# Patient Record
Sex: Female | Born: 1960
Health system: Southern US, Community
[De-identification: ages and names within clinical notes are randomized; demographics above are authoritative.]

## PROBLEM LIST (undated history)

## (undated) DIAGNOSIS — E756 Lipid storage disorder, unspecified: Secondary | ICD-10-CM

## (undated) DIAGNOSIS — K5229 Other allergic and dietetic gastroenteritis and colitis: Secondary | ICD-10-CM

## (undated) DIAGNOSIS — R202 Paresthesia of skin: Secondary | ICD-10-CM

## (undated) DIAGNOSIS — E8809 Other disorders of plasma-protein metabolism, not elsewhere classified: Secondary | ICD-10-CM

## (undated) HISTORY — PX: LASIK: SHX215

## (undated) HISTORY — PX: HAND SURGERY: SHX662

## (undated) HISTORY — DX: Lipid storage disorder, unspecified: E75.6

## (undated) HISTORY — PX: CARPAL TUNNEL RELEASE: SHX101

## (undated) HISTORY — DX: Other allergic and dietetic gastroenteritis and colitis: K52.29

## (undated) HISTORY — DX: Paresthesia of skin: R20.2

## (undated) HISTORY — PX: OTHER SURGICAL HISTORY: SHX169

## (undated) HISTORY — PX: DILATION AND CURETTAGE OF UTERUS: SHX78

## (undated) HISTORY — DX: Other disorders of plasma-protein metabolism, not elsewhere classified: E88.09

---

## 2006-01-07 ENCOUNTER — Encounter: Admission: RE | Admit: 2006-01-07 | Discharge: 2006-01-07 | Payer: Self-pay | Admitting: General Surgery

## 2006-07-12 ENCOUNTER — Encounter: Admission: RE | Admit: 2006-07-12 | Discharge: 2006-07-12 | Payer: Self-pay | Admitting: *Deleted

## 2007-01-25 ENCOUNTER — Encounter: Admission: RE | Admit: 2007-01-25 | Discharge: 2007-01-25 | Payer: Self-pay | Admitting: *Deleted

## 2008-03-01 ENCOUNTER — Encounter: Admission: RE | Admit: 2008-03-01 | Discharge: 2008-03-01 | Payer: Self-pay | Admitting: Family Medicine

## 2009-03-19 ENCOUNTER — Encounter: Admission: RE | Admit: 2009-03-19 | Discharge: 2009-03-19 | Payer: Self-pay | Admitting: Family Medicine

## 2010-04-20 ENCOUNTER — Encounter
Admission: RE | Admit: 2010-04-20 | Discharge: 2010-04-20 | Payer: Self-pay | Source: Home / Self Care | Attending: Family Medicine | Admitting: Family Medicine

## 2011-06-15 ENCOUNTER — Other Ambulatory Visit: Payer: Self-pay | Admitting: Family Medicine

## 2011-06-15 DIAGNOSIS — Z1231 Encounter for screening mammogram for malignant neoplasm of breast: Secondary | ICD-10-CM

## 2011-07-02 ENCOUNTER — Ambulatory Visit
Admission: RE | Admit: 2011-07-02 | Discharge: 2011-07-02 | Disposition: A | Discharge status: Home / Self Care | Payer: Self-pay | Source: Ambulatory Visit | Attending: Family Medicine | Admitting: Family Medicine

## 2011-07-02 DIAGNOSIS — Z1231 Encounter for screening mammogram for malignant neoplasm of breast: Secondary | ICD-10-CM

## 2012-10-20 ENCOUNTER — Other Ambulatory Visit: Payer: Self-pay

## 2012-10-20 DIAGNOSIS — Z1231 Encounter for screening mammogram for malignant neoplasm of breast: Secondary | ICD-10-CM

## 2012-11-09 ENCOUNTER — Ambulatory Visit
Admission: RE | Admit: 2012-11-09 | Discharge: 2012-11-09 | Disposition: A | Discharge status: Home / Self Care | Payer: BC Managed Care – PPO | Source: Ambulatory Visit

## 2012-11-09 DIAGNOSIS — Z1231 Encounter for screening mammogram for malignant neoplasm of breast: Secondary | ICD-10-CM

## 2013-12-19 ENCOUNTER — Other Ambulatory Visit: Payer: Self-pay

## 2013-12-19 DIAGNOSIS — Z1231 Encounter for screening mammogram for malignant neoplasm of breast: Secondary | ICD-10-CM

## 2013-12-26 ENCOUNTER — Ambulatory Visit: Payer: BC Managed Care – PPO

## 2013-12-28 ENCOUNTER — Ambulatory Visit: Payer: BC Managed Care – PPO

## 2013-12-31 ENCOUNTER — Ambulatory Visit
Admission: RE | Admit: 2013-12-31 | Discharge: 2013-12-31 | Disposition: A | Discharge status: Home / Self Care | Payer: BC Managed Care – PPO | Source: Ambulatory Visit

## 2013-12-31 DIAGNOSIS — Z1231 Encounter for screening mammogram for malignant neoplasm of breast: Secondary | ICD-10-CM

## 2015-03-03 ENCOUNTER — Other Ambulatory Visit: Payer: Self-pay

## 2015-03-03 DIAGNOSIS — Z1231 Encounter for screening mammogram for malignant neoplasm of breast: Secondary | ICD-10-CM

## 2015-03-21 ENCOUNTER — Ambulatory Visit: Payer: Self-pay

## 2015-03-27 ENCOUNTER — Ambulatory Visit
Admission: RE | Admit: 2015-03-27 | Discharge: 2015-03-27 | Disposition: A | Discharge status: Home / Self Care | Payer: BLUE CROSS/BLUE SHIELD | Source: Ambulatory Visit

## 2015-03-27 DIAGNOSIS — Z1231 Encounter for screening mammogram for malignant neoplasm of breast: Secondary | ICD-10-CM

## 2015-10-06 ENCOUNTER — Ambulatory Visit (INDEPENDENT_AMBULATORY_CARE_PROVIDER_SITE_OTHER): Payer: BLUE CROSS/BLUE SHIELD | Admitting: Otolaryngology

## 2015-10-06 DIAGNOSIS — H6981 Other specified disorders of Eustachian tube, right ear: Secondary | ICD-10-CM

## 2015-10-06 DIAGNOSIS — J310 Chronic rhinitis: Secondary | ICD-10-CM | POA: Diagnosis not present

## 2015-10-06 DIAGNOSIS — H9011 Conductive hearing loss, unilateral, right ear, with unrestricted hearing on the contralateral side: Secondary | ICD-10-CM | POA: Diagnosis not present

## 2015-10-06 DIAGNOSIS — H6521 Chronic serous otitis media, right ear: Secondary | ICD-10-CM | POA: Diagnosis not present

## 2015-10-06 DIAGNOSIS — J343 Hypertrophy of nasal turbinates: Secondary | ICD-10-CM

## 2015-11-10 ENCOUNTER — Ambulatory Visit (INDEPENDENT_AMBULATORY_CARE_PROVIDER_SITE_OTHER): Payer: BLUE CROSS/BLUE SHIELD | Admitting: Otolaryngology

## 2016-04-14 ENCOUNTER — Other Ambulatory Visit: Payer: Self-pay | Admitting: Family Medicine

## 2016-04-14 DIAGNOSIS — Z1231 Encounter for screening mammogram for malignant neoplasm of breast: Secondary | ICD-10-CM

## 2016-05-07 ENCOUNTER — Ambulatory Visit
Admission: RE | Admit: 2016-05-07 | Discharge: 2016-05-07 | Disposition: A | Discharge status: Home / Self Care | Payer: BLUE CROSS/BLUE SHIELD | Source: Ambulatory Visit | Attending: Family Medicine | Admitting: Family Medicine

## 2016-05-07 DIAGNOSIS — Z1231 Encounter for screening mammogram for malignant neoplasm of breast: Secondary | ICD-10-CM | POA: Diagnosis not present

## 2016-10-13 DIAGNOSIS — D509 Iron deficiency anemia, unspecified: Secondary | ICD-10-CM | POA: Diagnosis not present

## 2016-10-13 DIAGNOSIS — R739 Hyperglycemia, unspecified: Secondary | ICD-10-CM | POA: Diagnosis not present

## 2016-10-13 DIAGNOSIS — K210 Gastro-esophageal reflux disease with esophagitis: Secondary | ICD-10-CM | POA: Diagnosis not present

## 2016-10-13 DIAGNOSIS — R7301 Impaired fasting glucose: Secondary | ICD-10-CM | POA: Diagnosis not present

## 2016-10-20 DIAGNOSIS — R7301 Impaired fasting glucose: Secondary | ICD-10-CM | POA: Diagnosis not present

## 2016-10-20 DIAGNOSIS — G5602 Carpal tunnel syndrome, left upper limb: Secondary | ICD-10-CM | POA: Diagnosis not present

## 2016-10-20 DIAGNOSIS — G2581 Restless legs syndrome: Secondary | ICD-10-CM | POA: Diagnosis not present

## 2016-10-20 DIAGNOSIS — K210 Gastro-esophageal reflux disease with esophagitis: Secondary | ICD-10-CM | POA: Diagnosis not present

## 2016-10-28 DIAGNOSIS — G5621 Lesion of ulnar nerve, right upper limb: Secondary | ICD-10-CM | POA: Insufficient documentation

## 2016-10-28 DIAGNOSIS — M79641 Pain in right hand: Secondary | ICD-10-CM | POA: Insufficient documentation

## 2016-10-28 DIAGNOSIS — M5090 Cervical disc disorder, unspecified, unspecified cervical region: Secondary | ICD-10-CM | POA: Insufficient documentation

## 2016-10-28 DIAGNOSIS — M79642 Pain in left hand: Secondary | ICD-10-CM | POA: Diagnosis not present

## 2016-10-28 DIAGNOSIS — G5603 Carpal tunnel syndrome, bilateral upper limbs: Secondary | ICD-10-CM | POA: Diagnosis not present

## 2016-11-09 DIAGNOSIS — M79642 Pain in left hand: Secondary | ICD-10-CM | POA: Diagnosis not present

## 2016-11-09 DIAGNOSIS — M79641 Pain in right hand: Secondary | ICD-10-CM | POA: Diagnosis not present

## 2016-11-13 DIAGNOSIS — Z6827 Body mass index (BMI) 27.0-27.9, adult: Secondary | ICD-10-CM | POA: Diagnosis not present

## 2016-11-13 DIAGNOSIS — G43909 Migraine, unspecified, not intractable, without status migrainosus: Secondary | ICD-10-CM | POA: Diagnosis not present

## 2016-11-15 DIAGNOSIS — M5090 Cervical disc disorder, unspecified, unspecified cervical region: Secondary | ICD-10-CM | POA: Diagnosis not present

## 2016-11-15 DIAGNOSIS — M79642 Pain in left hand: Secondary | ICD-10-CM | POA: Diagnosis not present

## 2016-11-15 DIAGNOSIS — M79641 Pain in right hand: Secondary | ICD-10-CM | POA: Diagnosis not present

## 2016-11-23 ENCOUNTER — Other Ambulatory Visit: Payer: Self-pay | Admitting: Orthopedic Surgery

## 2016-11-23 DIAGNOSIS — M5412 Radiculopathy, cervical region: Secondary | ICD-10-CM

## 2016-12-04 ENCOUNTER — Ambulatory Visit
Admission: RE | Admit: 2016-12-04 | Discharge: 2016-12-04 | Disposition: A | Discharge status: Home / Self Care | Payer: Self-pay | Source: Ambulatory Visit | Attending: Orthopedic Surgery | Admitting: Orthopedic Surgery

## 2016-12-04 DIAGNOSIS — M5412 Radiculopathy, cervical region: Secondary | ICD-10-CM

## 2016-12-04 DIAGNOSIS — M50221 Other cervical disc displacement at C4-C5 level: Secondary | ICD-10-CM | POA: Diagnosis not present

## 2017-01-03 DIAGNOSIS — Z6827 Body mass index (BMI) 27.0-27.9, adult: Secondary | ICD-10-CM | POA: Diagnosis not present

## 2017-01-03 DIAGNOSIS — G43719 Chronic migraine without aura, intractable, without status migrainosus: Secondary | ICD-10-CM | POA: Diagnosis not present

## 2017-01-07 DIAGNOSIS — H748X1 Other specified disorders of right middle ear and mastoid: Secondary | ICD-10-CM | POA: Diagnosis not present

## 2017-01-07 DIAGNOSIS — G43719 Chronic migraine without aura, intractable, without status migrainosus: Secondary | ICD-10-CM | POA: Diagnosis not present

## 2017-01-07 DIAGNOSIS — G43909 Migraine, unspecified, not intractable, without status migrainosus: Secondary | ICD-10-CM | POA: Diagnosis not present

## 2017-01-17 DIAGNOSIS — M79601 Pain in right arm: Secondary | ICD-10-CM | POA: Diagnosis not present

## 2017-01-17 DIAGNOSIS — M5412 Radiculopathy, cervical region: Secondary | ICD-10-CM | POA: Diagnosis not present

## 2017-01-17 DIAGNOSIS — R202 Paresthesia of skin: Secondary | ICD-10-CM | POA: Diagnosis not present

## 2017-01-17 DIAGNOSIS — M542 Cervicalgia: Secondary | ICD-10-CM | POA: Diagnosis not present

## 2017-01-31 ENCOUNTER — Encounter: Payer: Self-pay | Admitting: Neurology

## 2017-02-08 DIAGNOSIS — L240 Irritant contact dermatitis due to detergents: Secondary | ICD-10-CM | POA: Diagnosis not present

## 2017-02-08 DIAGNOSIS — Z6827 Body mass index (BMI) 27.0-27.9, adult: Secondary | ICD-10-CM | POA: Diagnosis not present

## 2017-02-17 ENCOUNTER — Ambulatory Visit (INDEPENDENT_AMBULATORY_CARE_PROVIDER_SITE_OTHER): Payer: BLUE CROSS/BLUE SHIELD | Admitting: Otolaryngology

## 2017-02-17 DIAGNOSIS — J343 Hypertrophy of nasal turbinates: Secondary | ICD-10-CM

## 2017-02-17 DIAGNOSIS — R51 Headache: Secondary | ICD-10-CM | POA: Diagnosis not present

## 2017-02-17 DIAGNOSIS — J310 Chronic rhinitis: Secondary | ICD-10-CM

## 2017-05-02 DIAGNOSIS — F419 Anxiety disorder, unspecified: Secondary | ICD-10-CM | POA: Insufficient documentation

## 2017-05-02 DIAGNOSIS — K219 Gastro-esophageal reflux disease without esophagitis: Secondary | ICD-10-CM | POA: Insufficient documentation

## 2017-05-06 ENCOUNTER — Ambulatory Visit: Payer: Self-pay | Admitting: Neurology

## 2017-05-13 ENCOUNTER — Other Ambulatory Visit (INDEPENDENT_AMBULATORY_CARE_PROVIDER_SITE_OTHER): Payer: BLUE CROSS/BLUE SHIELD

## 2017-05-13 ENCOUNTER — Ambulatory Visit: Payer: BLUE CROSS/BLUE SHIELD | Admitting: Neurology

## 2017-05-13 ENCOUNTER — Encounter: Payer: Self-pay | Admitting: Neurology

## 2017-05-13 VITALS — BP 100/60 | HR 87 | Ht 59.0 in | Wt 138.1 lb

## 2017-05-13 DIAGNOSIS — R519 Headache, unspecified: Secondary | ICD-10-CM

## 2017-05-13 DIAGNOSIS — R51 Headache: Secondary | ICD-10-CM

## 2017-05-13 DIAGNOSIS — G5623 Lesion of ulnar nerve, bilateral upper limbs: Secondary | ICD-10-CM | POA: Insufficient documentation

## 2017-05-13 DIAGNOSIS — M4802 Spinal stenosis, cervical region: Secondary | ICD-10-CM

## 2017-05-13 LAB — SEDIMENTATION RATE: Sed Rate: 11 mm/h (ref 0–30)

## 2017-05-13 LAB — C-REACTIVE PROTEIN: CRP: 0.3 mg/dL — ABNORMAL LOW (ref 0.5–20.0)

## 2017-05-13 LAB — VITAMIN B12: Vitamin B-12: 708 pg/mL (ref 211–911)

## 2017-05-13 MED ORDER — GABAPENTIN 300 MG PO CAPS: 300.0000 mg | ORAL_CAPSULE | Freq: Every day | ORAL | 5 refills | Status: DC

## 2017-05-13 NOTE — Progress Notes (Signed)
[facility] Clinic Note - Initial Visit   Date: 05/13/17  Bonnie Mckee MRN: [id number] DOB: [birth year]   Dear Dr. Vertell Limber:  Thank you for your kind referral of Bonnie Mckee for consultation of bilateral arm paresthesias. Although her history is well known to you, please allow Bonnie Mckee to reiterate it for the purpose of our medical record. The patient was accompanied to the clinic by self.   History of Present Illness: Bonnie Mckee is a 57 y.o. right-handed Caucasian female with s/p bilateral CTS release x 2 and bilateral ulnar nerve decompression, GERD, and anxiety  presenting for evaluation of bilateral hand pain.    Starting in early 2018, she began experiencing episodic numbness and tingling in the hands, involving the 4th and 5th digits.  She has found that laying supine, reading/holding arms up, or driving can aggravate her numbness, especially if her arms are in this position for a long time. She has stabbing pain in the elbow region which occurs a few times per week, lasting a few minutes.  She also has achy pain in the elbow and fingers. She has weakness of the hands and finds herself dropping objects.   She initially saw Dr. Burney Gauze, orthopeadics, in July 2018 who ordered NCS/EMG of the arms which showed probably C8 radiculopathy (study is not available to review personally).  Therefore, she had imaging of the cervical spine in September 2018 which showed foraminal stenosis and canal stenosis at C5-6 and C6-7, as well as a very small focal cord signal hyperintensity at C5-6.  She was then referred to Dr. Vertell Limber for further evaluation who did not feel strongly that her symptoms were stemming from her neck and suggested that she see me.  Around June 2018, she also complains of pulsating and "whooshing" headache in her head, which was constant and severe initially.  She had MRI brain performed at [facility] which did not show any structural pathology, on my review, there  are two small left frontal cortical hyperintensities and otherwise unremarkable.  Her pulsating headaches continued and she saw ENT whose evaluation was negative.  She has no prior history of headaches.  She denies nausea, vomiting, vision changes.   Overall, headaches are improving and not as severe as before.  Out-side paper records, electronic medical record, and images have been reviewed where available and summarized as:  MRI cervical spine wo contrast 01/17/2017: 1. Moderate right foraminal narrowing at C5-6 and C6-7 due to uncovertebral disease. 2. Slightly less prominent left moderate foraminal narrowing is present at C5-6. 3. Partial effacement of the ventral CSF at C5-6. No other significant central canal stenosis.  A focal area of cord signal abnormality is present posteriorly at C5-6 level. No other significant cord signal abnormality is present. This may be secondary to mass effect on the cord or trauma.  Past Medical History:  Diagnosis Date  . Paresthesias     Past Surgical History:  Procedure Laterality Date  . CARPAL TUNNEL RELEASE    . CESAREAN SECTION    . rectal sphincter repair       Medications:  Outpatient Encounter Medications as of 05/13/2017  Medication Sig  . cyclobenzaprine (FLEXERIL) 10 MG tablet TAKE ONE TABLET BY MOUTH AT BEDTIME.  . famotidine (PEPCID) 20 MG tablet Take 20 mg by mouth 2 (two) times daily.  Marland Kitchen FLUoxetine (PROZAC) 10 MG capsule Prozac 10 mg capsule  Take 1 capsule every day by oral route.  . OMEPRAZOLE PO omeprazole 20 mg capsule,delayed release  Take 1 capsule every day by oral route in the morning for 30 days.  Marland Kitchen PROAIR HFA 108 (90 Base) MCG/ACT inhaler   . Probiotic Product (Miguel Barrera) Hardin Negus' Colon Health  1 po qd  . triamcinolone cream (KENALOG) 0.1 % APPLY TOPICALLY TO AFFECTED AREAS TWICE DAILY  . [DISCONTINUED] FLUoxetine (PROZAC) 10 MG tablet Take 10 mg by mouth daily.   No facility-administered encounter  medications on file as of 05/13/2017.      Allergies:  Allergies  Allergen Reactions  . Hydrocodone-Acetaminophen Itching    Family History: Family History  Problem Relation Age of Onset  . Non-Hodgkin's lymphoma Father     Social History: Social History   Tobacco Use  . Smoking status: Former Smoker    Types: Cigarettes  . Smokeless tobacco: Never Used  Substance Use Topics  . Alcohol use: No    Frequency: Never  . Drug use: No   Social History   Social History Narrative   Lives with husband in a one story home with a basement.  Has 3 grown children.  Works as a Pharmacist, hospital.  Education: college.     Review of Systems:  CONSTITUTIONAL: No fevers, chills, night sweats, or weight loss.  + headaches EYES: No visual changes or eye pain ENT: No hearing changes.  No history of nose bleeds.   RESPIRATORY: No cough, wheezing and shortness of breath.   CARDIOVASCULAR: Negative for chest pain, and palpitations.   GI: Negative for abdominal discomfort, blood in stools or black stools.  No recent change in bowel habits.   GU:  No history of incontinence.   MUSCLOSKELETAL: +history of joint pain or swelling.  No myalgias.   SKIN: Negative for lesions, rash, and itching.   HEMATOLOGY/ONCOLOGY: Negative for prolonged bleeding, bruising easily, and swollen nodes.  No history of cancer.   ENDOCRINE: Negative for cold or heat intolerance, polydipsia or goiter.   PSYCH:  No depression or anxiety symptoms.   NEURO: As Above.   Vital Signs:  BP 100/60   Pulse 87   Ht 4' 11"  (1.499 m)   Wt 138 lb 2 oz (62.7 kg)   SpO2 97%   BMI 27.90 kg/m    General Medical Exam:   General:  Well appearing, comfortable.   Eyes/ENT: see cranial nerve examination.   Neck: No masses appreciated.  Full range of motion without tenderness.  No carotid bruits. Respiratory:  Clear to auscultation, good air entry bilaterally.   Cardiac:  Regular rate and rhythm, no murmur.   Extremities:  No deformities,  edema, or skin discoloration.  Skin:  No rashes or lesions.  Heavily tattooed arms  Neurological Exam: MENTAL STATUS including orientation to time, place, person, recent and remote memory, attention span and concentration, language, and fund of knowledge is normal.  Speech is not dysarthric.  CRANIAL NERVES: II:  No visual field defects.  Unremarkable fundi.   III-IV-VI: Pupils equal round and reactive to light.  Normal conjugate, extra-ocular eye movements in all directions of gaze.  No nystagmus.  Subtle left ptosis at rest.   V:  Normal facial sensation.    Jaw jerk is absent VII:  Normal facial symmetry and movements.  No pathologic facial reflexes.  VIII:  Normal hearing and vestibular function.   IX-X:  Normal palatal movement.   XI:  Normal shoulder shrug and head rotation.   XII:  Normal tongue strength and range of motion, no deviation or fasciculation.  MOTOR:  No  atrophy, fasciculations or abnormal movements.  No pronator drift.  Tone is normal.    Right Upper Extremity:    Left Upper Extremity:    Deltoid  5/5   Deltoid  5/5   Biceps  5/5   Biceps  5/5   Triceps  5/5   Triceps  5/5   Wrist extensors  5/5   Wrist extensors  5/5   Wrist flexors  5/5   Wrist flexors  5/5   Finger extensors  5/5   Finger extensors  5/5   Finger flexors  5/5   Finger flexors  5/5   Dorsal interossei  4+/5   Dorsal interossei  4+/5   Abductor pollicis  5/5   Abductor pollicis  5/5   Tone (Ashworth scale)  0  Tone (Ashworth scale)  0   Right Lower Extremity:    Left Lower Extremity:    Hip flexors  5/5   Hip flexors  5/5   Hip extensors  5/5   Hip extensors  5/5   Knee flexors  5/5   Knee flexors  5/5   Knee extensors  5/5   Knee extensors  5/5   Dorsiflexors  5/5   Dorsiflexors  5/5   Plantarflexors  5/5   Plantarflexors  5/5   Toe extensors  5/5   Toe extensors  5/5   Toe flexors  5/5   Toe flexors  5/5   Tone (Ashworth scale)  0  Tone (Ashworth scale)  0   MSRs:  Right                                                                  Left brachioradialis 2+  brachioradialis 2+  biceps 2+  biceps 2+  triceps 2+  triceps 2+  patellar 2+  patellar 2+  ankle jerk 2+  ankle jerk 2+  Hoffman no  Hoffman no  plantar response down  plantar response down   SENSORY:  Normal and symmetric perception of light touch, pinprick, vibration, and proprioception.  There is tenderness over the medial epicondyle and ulnar nerve is palpable.  Tinel's sign is markedly positive on the left >right at the elbow.  Tinel's is negative at the wrist.  COORDINATION/GAIT: Normal finger-to- nose-finger.  Intact rapid alternating movements bilaterally.  Gait narrow based and stable. Tandem and stressed gait intact.    IMPRESSION: 1.  Bilateral hand paresthesias with history of bilateral CTS release x 2 and bilateral ulnar nerve decompression, most concerning for ulnar nerve entrapment either by scar tissue or focal nerve compression/strething from positioning.  Her paresthesias conform to the ulnar nerve distribution bilaterally and on exam there highly positive Tinel's sign over the ulnar nerve at the elbow, worse on the left.   I have requested the report of her NCS/EMG performed at [facility] as I would have expected some changes in her ulnar nerve response.   Because of her history of ulnar nerve decompression, I will order ultrasound of the ulnar nerves to assess for structural changes (focal swelling, scarring, etc) across the elbow.   For symptom management, start gabapentin 315m at bedtime and titrate as needed.  She was also instructed to use a soft elbow pad to prevent repeated compression of the nerve and  use as an elbow block at night to prevent hyperflexion.  2.  New onset headaches in patient > 80 years old warrants additional imaging.  MRI brain did not show any primary abnormalities for her headaches.  To be complete, need to assess her vessel status with CTA head and neck to be sure aneurysm  is excluded.   3.  Cervical canal and foraminal stenosis at C5-6 and C6-7 with focal signal abnormality at C5-6.  Imaging personally viewed with patient.  There is a tiny central cord abnormality, which would not explain her arm paresthesias, but does not need to be assessed.  There are two small areas on her MRI brain which in combination with a cervical lesion, could indicate additional work-up for demyelinating disease, however, given that all lesions are very small, it is best to repeat MRI c-spine wwo contrast.  If there is enhancement or additional focal changes, CSF testing would be indicated.  In the meantime, check vitamin B12, ESR, CRP, copper, MMA.  Further recommendations will be based on her results  Thank you for allowing me to participate in patient's care.  If I can answer any additional questions, I would be pleased to do so.    Sincerely,    Donika K. Posey Pronto, DO

## 2017-05-13 NOTE — Patient Instructions (Addendum)
1.  Check labs  2.  Ultrasound of the ulnar nerves  3.  CTA head and neck  4.  MRI cervical spine wwo contrast  5.  Start gabapentin 300mg  at bedtime.  6.  Use a soft elbow pad to prevent compression of the nerve at your elbow; at night, use it as a block to prevent over flexion

## 2017-05-16 ENCOUNTER — Other Ambulatory Visit: Payer: Self-pay | Admitting: *Deleted

## 2017-05-16 ENCOUNTER — Telehealth: Payer: Self-pay | Admitting: Neurology

## 2017-05-16 DIAGNOSIS — G5621 Lesion of ulnar nerve, right upper limb: Secondary | ICD-10-CM

## 2017-05-16 NOTE — Telephone Encounter (Signed)
Patient called and said that she had a quick question for you and could you please call her. Thanks

## 2017-05-16 NOTE — Telephone Encounter (Signed)
Called patient and she is requesting for her tests to be done at [facility] instead of [facility].  Informed her that I will let her know when they are approved and she can call and schedule these.  Patient will also need valium.

## 2017-05-17 ENCOUNTER — Other Ambulatory Visit: Payer: Self-pay | Admitting: *Deleted

## 2017-05-17 ENCOUNTER — Other Ambulatory Visit: Payer: Self-pay | Admitting: Family Medicine

## 2017-05-17 DIAGNOSIS — R519 Headache, unspecified: Secondary | ICD-10-CM

## 2017-05-17 DIAGNOSIS — G5623 Lesion of ulnar nerve, bilateral upper limbs: Secondary | ICD-10-CM

## 2017-05-17 DIAGNOSIS — R51 Headache: Secondary | ICD-10-CM

## 2017-05-17 DIAGNOSIS — M4802 Spinal stenosis, cervical region: Secondary | ICD-10-CM

## 2017-05-17 DIAGNOSIS — Z1231 Encounter for screening mammogram for malignant neoplasm of breast: Secondary | ICD-10-CM

## 2017-05-17 LAB — COPPER, SERUM: COPPER: 143 ug/dL (ref 70–175)

## 2017-05-17 LAB — METHYLMALONIC ACID, SERUM: METHYLMALONIC ACID, QUANT: 82 nmol/L — AB (ref 87–318)

## 2017-05-20 ENCOUNTER — Telehealth: Payer: Self-pay | Admitting: Neurology

## 2017-05-20 ENCOUNTER — Telehealth: Payer: Self-pay | Admitting: *Deleted

## 2017-05-20 NOTE — Telephone Encounter (Signed)
Patient wants to check on the status of the referral

## 2017-05-20 NOTE — Telephone Encounter (Signed)
MRI brain wwo contrast [facility] 01/07/2017:  Normal MRI of the brain for age.

## 2017-05-20 NOTE — Telephone Encounter (Signed)
Patient given results

## 2017-05-20 NOTE — Telephone Encounter (Signed)
Informed patient that these are being worked on and that Dr. Allena KatzPatel may have to do a peer to peer review to get them covered.

## 2017-05-20 NOTE — Telephone Encounter (Signed)
-----   Message from Glendale Chardonika K Patel, DO sent at 05/17/2017  9:24 AM EST ----- Please notify patient lab are within normal limits.  Thank you.

## 2017-05-23 ENCOUNTER — Other Ambulatory Visit: Payer: Self-pay | Admitting: *Deleted

## 2017-05-23 DIAGNOSIS — R519 Headache, unspecified: Secondary | ICD-10-CM

## 2017-05-23 DIAGNOSIS — M4802 Spinal stenosis, cervical region: Secondary | ICD-10-CM

## 2017-05-23 DIAGNOSIS — R51 Headache: Principal | ICD-10-CM

## 2017-05-25 ENCOUNTER — Other Ambulatory Visit: Payer: Self-pay | Admitting: *Deleted

## 2017-05-25 MED ORDER — DIAZEPAM 5 MG PO TABS: ORAL_TABLET | ORAL | 0 refills | Status: DC

## 2017-05-25 NOTE — Progress Notes (Unsigned)
v

## 2017-05-31 ENCOUNTER — Telehealth: Payer: Self-pay | Admitting: Neurology

## 2017-05-31 NOTE — Telephone Encounter (Signed)
Patient said that she is having US on March 7.   Informed her that we will be in touch with the results.

## 2017-05-31 NOTE — Telephone Encounter (Signed)
Patient called with some questions for you. She was also returning your call. Thanks

## 2017-06-01 ENCOUNTER — Ambulatory Visit (HOSPITAL_COMMUNITY): Payer: BLUE CROSS/BLUE SHIELD

## 2017-06-01 ENCOUNTER — Encounter: Payer: Self-pay | Admitting: Neurology

## 2017-06-01 DIAGNOSIS — I6523 Occlusion and stenosis of bilateral carotid arteries: Secondary | ICD-10-CM | POA: Diagnosis not present

## 2017-06-01 DIAGNOSIS — M47812 Spondylosis without myelopathy or radiculopathy, cervical region: Secondary | ICD-10-CM | POA: Diagnosis not present

## 2017-06-02 ENCOUNTER — Telehealth: Payer: Self-pay | Admitting: Neurology

## 2017-06-02 NOTE — Telephone Encounter (Signed)
Dois DavenportSandra called needing to speak with you regarding patient's  appt for tomorrow. Her EXT is [phone]. Please Call. Thanks

## 2017-06-02 NOTE — Telephone Encounter (Signed)
Left message for Sandra to  call me back  .

## 2017-06-03 ENCOUNTER — Telehealth: Payer: Self-pay | Admitting: Neurology

## 2017-06-03 DIAGNOSIS — R51 Headache: Secondary | ICD-10-CM | POA: Diagnosis not present

## 2017-06-03 NOTE — Telephone Encounter (Signed)
I attempted to contact patient via phone today regarding the results of US carotids and MRI cervical spine, however there was no answer so a message was left for the patient to return my call.   US carotids 06/01/2017:  Less than 50% ICA stenosis bilaterally  MRI cervical spine wo contrast 06/01/2017:   Punctate focus of signal abnormality in the posterior cord at C5-6 does not enhance.  Signal abnormality may be due to prior trauma or some inflammatory process.  The appearance is not typical of demyelinating disease. No change in the cervical spondylosis most notable at C5-6 where the ventral thecal sac is effaced and mild to moderate foraminal narrowing is worse on the right.  There is no enhancement of spinal cord hyperintensity and no change in size, favoring against demyelinating disease.    Donika K. Allena KatzPatel, DO

## 2017-06-06 MED ORDER — NORTRIPTYLINE HCL 10 MG PO CAPS: ORAL_CAPSULE | ORAL | 5 refills | Status: DC

## 2017-06-06 NOTE — Addendum Note (Signed)
Addended by: Glendale ChardPATEL, Kariann Wecker K on: 06/06/2017 04:27 PM   Modules accepted: Orders

## 2017-06-06 NOTE — Telephone Encounter (Signed)
CTA head 06/03/2017:   1.  Negative CTA 2.  Normal CT appearance of brain.   3.  Mild inflammatory/postinflammatory changes in the left sphenoid sinus and right mastoid air cells are stable since 2018, significance doubtful.  Discussed results with patient which has excluded any worrisome causes of her head symptoms.  Recommend treating as migraine variant with nortriptyline.  Start nortriptyline 10mg  at bedtime for 2 week, then increase to 2 tablet at bedtime.  Side effects discussed.  Donika K. Allena KatzPatel, DO

## 2017-06-07 ENCOUNTER — Telehealth: Payer: Self-pay | Admitting: Neurology

## 2017-06-07 NOTE — Telephone Encounter (Signed)
Please call the pharmacy and see if there is option of tablets.

## 2017-06-07 NOTE — Telephone Encounter (Signed)
OK, let's go ahead with gabapentin 300mg  at bedtime x 1 week, then 300mg  twice daily.

## 2017-06-07 NOTE — Telephone Encounter (Signed)
Rx called in to [facility]. 

## 2017-06-07 NOTE — Telephone Encounter (Signed)
I spoke with patient and she can't take capsules due to the gel allergy.  I will have these changed to tablets.  She also wanted to make sure that you did not want to try the gabapentin first.  The only reason she did not start it last time is because she was on doxycycline and it was making her feel bad.  Can we still go with the nortriptyline first?

## 2017-06-07 NOTE — Telephone Encounter (Signed)
Pt left a message regarding her prescription for Nortriptyline and needs a call back

## 2017-06-07 NOTE — Telephone Encounter (Signed)
No option of tablets.

## 2017-06-09 ENCOUNTER — Ambulatory Visit (HOSPITAL_COMMUNITY)
Admission: RE | Admit: 2017-06-09 | Discharge: 2017-06-09 | Disposition: A | Discharge status: Home / Self Care | Payer: BLUE CROSS/BLUE SHIELD | Source: Ambulatory Visit | Attending: Neurology | Admitting: Neurology

## 2017-06-09 ENCOUNTER — Other Ambulatory Visit: Payer: Self-pay | Admitting: Neurology

## 2017-06-09 DIAGNOSIS — G5621 Lesion of ulnar nerve, right upper limb: Secondary | ICD-10-CM | POA: Diagnosis not present

## 2017-06-09 DIAGNOSIS — G5622 Lesion of ulnar nerve, left upper limb: Secondary | ICD-10-CM | POA: Diagnosis not present

## 2017-06-10 ENCOUNTER — Ambulatory Visit: Payer: BLUE CROSS/BLUE SHIELD

## 2017-06-16 ENCOUNTER — Telehealth: Payer: Self-pay | Admitting: Neurology

## 2017-06-16 NOTE — Telephone Encounter (Signed)
Pt left a VM message wanted to know if her test results were back yet and would like a call back if they are

## 2017-06-17 MED ORDER — AMITRIPTYLINE HCL 10 MG PO TABS: 10.0000 mg | ORAL_TABLET | Freq: Every day | ORAL | 3 refills | Status: DC

## 2017-06-17 NOTE — Telephone Encounter (Signed)
Referral sent 

## 2017-06-17 NOTE — Telephone Encounter (Signed)
Nerve US results?

## 2017-06-17 NOTE — Telephone Encounter (Signed)
Called and informed patient that her ultrasound shows scar tissue around the right ulnar nerve, which most likely explains her arm paresthesias.  No evidence of structural pathology on the left side.   She will be referred to hand orthopeadics for further management.  She has many questions regarding her medications and ongoing headaches.  She is taking gapapentin 300mg  at bedtime which initially helped, but is unable to increase gabapentin due to sedation.  She cannot take any capsulated medications due to allergy, so I will start her on amitriptyline 10mg  at bedtime to see if it helps her headaches and paresthesias.  For cervicalgia, refer for neck physiotherapy.  Kou Gucciardo K. Allena KatzPatel, DO

## 2017-07-08 ENCOUNTER — Telehealth: Payer: Self-pay | Admitting: Neurology

## 2017-07-08 ENCOUNTER — Ambulatory Visit
Admission: RE | Admit: 2017-07-08 | Discharge: 2017-07-08 | Disposition: A | Discharge status: Home / Self Care | Payer: BLUE CROSS/BLUE SHIELD | Source: Ambulatory Visit | Attending: Family Medicine | Admitting: Family Medicine

## 2017-07-08 DIAGNOSIS — Z1231 Encounter for screening mammogram for malignant neoplasm of breast: Secondary | ICD-10-CM | POA: Diagnosis not present

## 2017-07-08 NOTE — Telephone Encounter (Signed)
Attempted to call patient back.  The phone just rang and then stopped.  I will try again later.

## 2017-07-08 NOTE — Telephone Encounter (Signed)
Pt called in regards to 2 referrals, not yet has been scheduled for either and pt is having some confusion regarding these referrals, please call and advise CB# [phone]

## 2017-07-11 NOTE — Telephone Encounter (Signed)
Referrals sent again.  PT and Hand and orthopedic surgeon referral sent.

## 2017-07-18 ENCOUNTER — Telehealth: Payer: Self-pay | Admitting: Neurology

## 2017-07-18 NOTE — Telephone Encounter (Signed)
Patient has back again regarding her Referral. She said it has been 6 weeks since the last test. She is really wanting to make the appointments and be seen please. She is following up on the Referrals that were to be set up. Please Call. Thanks

## 2017-07-18 NOTE — Telephone Encounter (Signed)
Morrie SheldonAshley, please follow up.

## 2017-07-20 NOTE — Telephone Encounter (Signed)
I spoke with patient she has heard from PT and will call [facility] to schedule appt.

## 2017-07-22 DIAGNOSIS — M6281 Muscle weakness (generalized): Secondary | ICD-10-CM | POA: Diagnosis not present

## 2017-07-22 DIAGNOSIS — M542 Cervicalgia: Secondary | ICD-10-CM | POA: Diagnosis not present

## 2017-07-22 DIAGNOSIS — M799 Soft tissue disorder, unspecified: Secondary | ICD-10-CM | POA: Diagnosis not present

## 2017-07-22 DIAGNOSIS — R51 Headache: Secondary | ICD-10-CM | POA: Diagnosis not present

## 2017-07-25 DIAGNOSIS — M799 Soft tissue disorder, unspecified: Secondary | ICD-10-CM | POA: Diagnosis not present

## 2017-07-25 DIAGNOSIS — M6281 Muscle weakness (generalized): Secondary | ICD-10-CM | POA: Diagnosis not present

## 2017-07-25 DIAGNOSIS — M542 Cervicalgia: Secondary | ICD-10-CM | POA: Diagnosis not present

## 2017-07-25 DIAGNOSIS — R51 Headache: Secondary | ICD-10-CM | POA: Diagnosis not present

## 2017-08-22 DIAGNOSIS — M6281 Muscle weakness (generalized): Secondary | ICD-10-CM | POA: Diagnosis not present

## 2017-08-22 DIAGNOSIS — M799 Soft tissue disorder, unspecified: Secondary | ICD-10-CM | POA: Diagnosis not present

## 2017-08-22 DIAGNOSIS — M542 Cervicalgia: Secondary | ICD-10-CM | POA: Diagnosis not present

## 2017-08-22 DIAGNOSIS — R51 Headache: Secondary | ICD-10-CM | POA: Diagnosis not present

## 2017-08-24 DIAGNOSIS — M6281 Muscle weakness (generalized): Secondary | ICD-10-CM | POA: Diagnosis not present

## 2017-08-24 DIAGNOSIS — R51 Headache: Secondary | ICD-10-CM | POA: Diagnosis not present

## 2017-08-24 DIAGNOSIS — M542 Cervicalgia: Secondary | ICD-10-CM | POA: Diagnosis not present

## 2017-08-24 DIAGNOSIS — M799 Soft tissue disorder, unspecified: Secondary | ICD-10-CM | POA: Diagnosis not present

## 2017-08-25 DIAGNOSIS — G5621 Lesion of ulnar nerve, right upper limb: Secondary | ICD-10-CM | POA: Diagnosis not present

## 2017-08-25 DIAGNOSIS — M5090 Cervical disc disorder, unspecified, unspecified cervical region: Secondary | ICD-10-CM | POA: Diagnosis not present

## 2017-08-25 DIAGNOSIS — M79641 Pain in right hand: Secondary | ICD-10-CM | POA: Diagnosis not present

## 2017-08-25 DIAGNOSIS — G5603 Carpal tunnel syndrome, bilateral upper limbs: Secondary | ICD-10-CM | POA: Diagnosis not present

## 2017-08-31 DIAGNOSIS — M6281 Muscle weakness (generalized): Secondary | ICD-10-CM | POA: Diagnosis not present

## 2017-08-31 DIAGNOSIS — M799 Soft tissue disorder, unspecified: Secondary | ICD-10-CM | POA: Diagnosis not present

## 2017-08-31 DIAGNOSIS — R51 Headache: Secondary | ICD-10-CM | POA: Diagnosis not present

## 2017-08-31 DIAGNOSIS — M542 Cervicalgia: Secondary | ICD-10-CM | POA: Diagnosis not present

## 2017-09-01 ENCOUNTER — Telehealth: Payer: Self-pay | Admitting: *Deleted

## 2017-09-01 ENCOUNTER — Other Ambulatory Visit: Payer: Self-pay | Admitting: *Deleted

## 2017-09-01 MED ORDER — AMITRIPTYLINE HCL 10 MG PO TABS: ORAL_TABLET | ORAL | 5 refills | Status: DC

## 2017-09-01 NOTE — Telephone Encounter (Signed)
Patient given instructions and new Rx sent.   

## 2017-09-01 NOTE — Telephone Encounter (Signed)
Patient called to let us know that she is still having headaches.  She would like to increase the amitriptyline at bedtime if possible.  Please advise.

## 2017-09-01 NOTE — Telephone Encounter (Signed)
OK to increase to amitriptyline  (take two  tablets) at bedtime.  Please send new Rx, if needed.  Lenix Kidd K. Allena Katz, DO

## 2017-09-05 DIAGNOSIS — M542 Cervicalgia: Secondary | ICD-10-CM | POA: Diagnosis not present

## 2017-09-05 DIAGNOSIS — R51 Headache: Secondary | ICD-10-CM | POA: Diagnosis not present

## 2017-09-05 DIAGNOSIS — M799 Soft tissue disorder, unspecified: Secondary | ICD-10-CM | POA: Diagnosis not present

## 2017-09-05 DIAGNOSIS — M6281 Muscle weakness (generalized): Secondary | ICD-10-CM | POA: Diagnosis not present

## 2017-09-08 DIAGNOSIS — M799 Soft tissue disorder, unspecified: Secondary | ICD-10-CM | POA: Diagnosis not present

## 2017-09-08 DIAGNOSIS — R51 Headache: Secondary | ICD-10-CM | POA: Diagnosis not present

## 2017-09-08 DIAGNOSIS — M6281 Muscle weakness (generalized): Secondary | ICD-10-CM | POA: Diagnosis not present

## 2017-09-08 DIAGNOSIS — M542 Cervicalgia: Secondary | ICD-10-CM | POA: Diagnosis not present

## 2017-09-09 ENCOUNTER — Telehealth: Payer: Self-pay | Admitting: Neurology

## 2017-09-09 NOTE — Telephone Encounter (Signed)
Patient called and lmom needing to speak with you or Dr. Allena KatzPatel regarding a surgery that she will be having and she has some questions and concerns she would like to talk with you about before she goes through with the surgery. Please call. Thanks

## 2017-09-09 NOTE — Telephone Encounter (Signed)
Attempted to contact patient. Mailbox is full.

## 2017-09-12 NOTE — Telephone Encounter (Signed)
Attempted to contact patient but mailbox is full.

## 2017-09-13 NOTE — Telephone Encounter (Signed)
I spoke with patient and she was calling to make sure all necessary tests were done since she is having surgery.  Informed her that all tests have been done.  Patient said that she was just feeling anxious and worried but felt better after we talked.

## 2017-09-14 DIAGNOSIS — G5621 Lesion of ulnar nerve, right upper limb: Secondary | ICD-10-CM | POA: Diagnosis not present

## 2017-09-14 DIAGNOSIS — G5601 Carpal tunnel syndrome, right upper limb: Secondary | ICD-10-CM | POA: Diagnosis not present

## 2018-02-24 DIAGNOSIS — M359 Systemic involvement of connective tissue, unspecified: Secondary | ICD-10-CM | POA: Diagnosis not present

## 2018-02-24 DIAGNOSIS — R768 Other specified abnormal immunological findings in serum: Secondary | ICD-10-CM | POA: Diagnosis not present

## 2018-02-24 DIAGNOSIS — Z79899 Other long term (current) drug therapy: Secondary | ICD-10-CM | POA: Diagnosis not present

## 2018-02-24 DIAGNOSIS — R7989 Other specified abnormal findings of blood chemistry: Secondary | ICD-10-CM | POA: Diagnosis not present

## 2018-03-09 IMAGING — MR MR CERVICAL SPINE W/O CM
5 series · 28 of 48 positions shown · non-contrast
Comparison: None.

ADDENDUM:
A focal area of cord signal abnormality is present posteriorly at
C5-6 level. No other significant cord signal abnormality is present.
This may be secondary to mass effect on the cord or trauma.
CLINICAL DATA: Cervical radiculopathy. Numbness and stiffness and
both upper extremities for 3 months. Limited range of motion. Neck
pain.

EXAM:
MRI CERVICAL SPINE WITHOUT CONTRAST
TECHNIQUE: Multiplanar, multisequence MR imaging of the cervical spine was
performed. No intravenous contrast was administered.

[Series 3: T1 · sagittal · 3.0mm · 0.41mm/px · 6 of 13 slices shown]
[im 1/13]
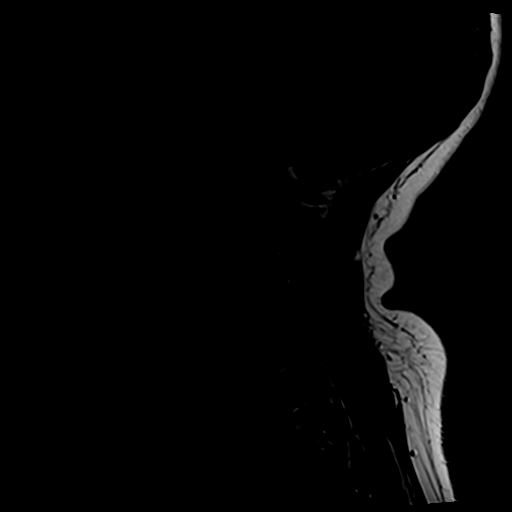
[im 3/13]
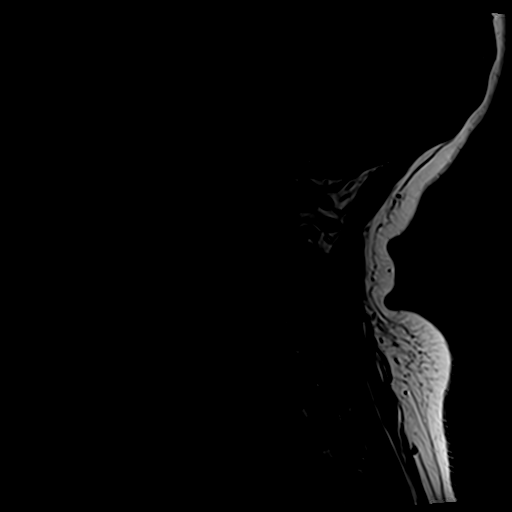
[im 5/13]
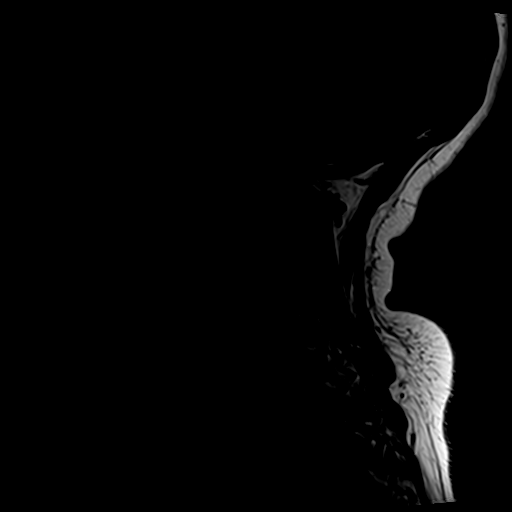
[im 8/13]
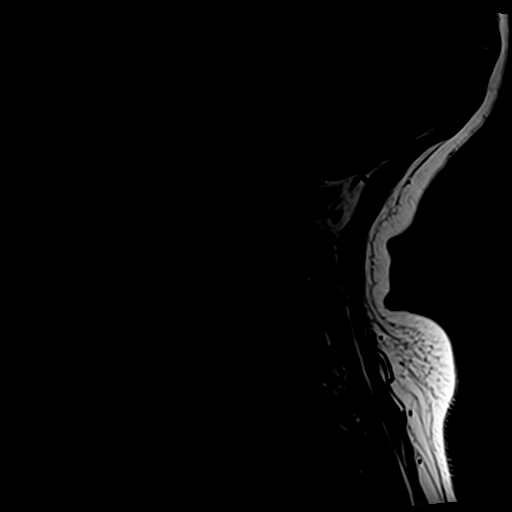
[im 10/13]
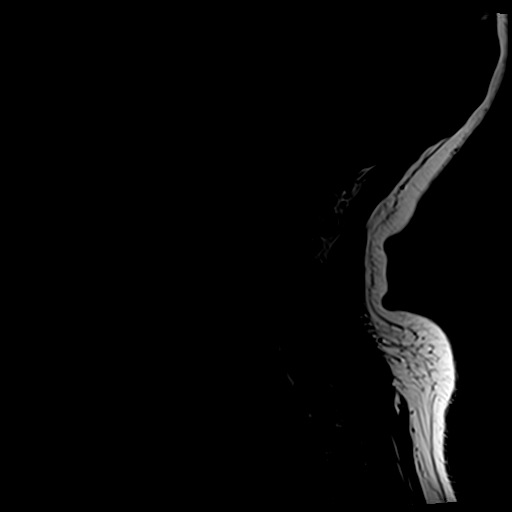
[im 13/13]
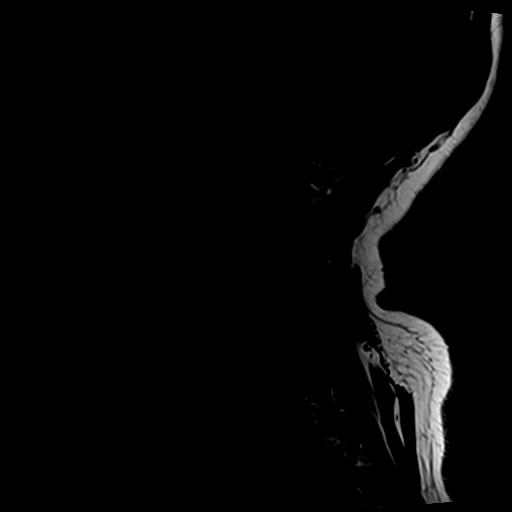

[Series 4: tir sag · sagittal · 3.0mm · 0.41mm/px · 6 of 13 slices shown]
[im 1/13]
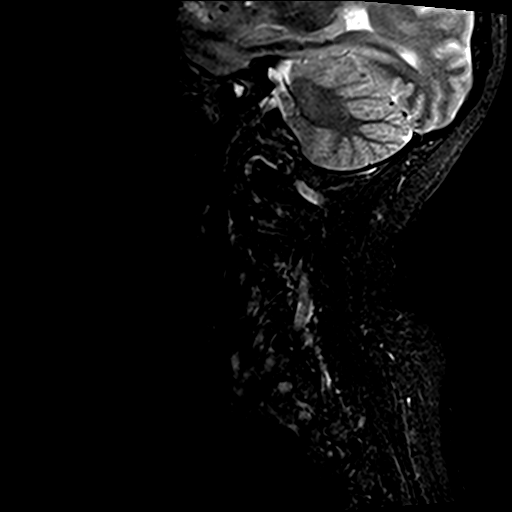
[im 3/13]
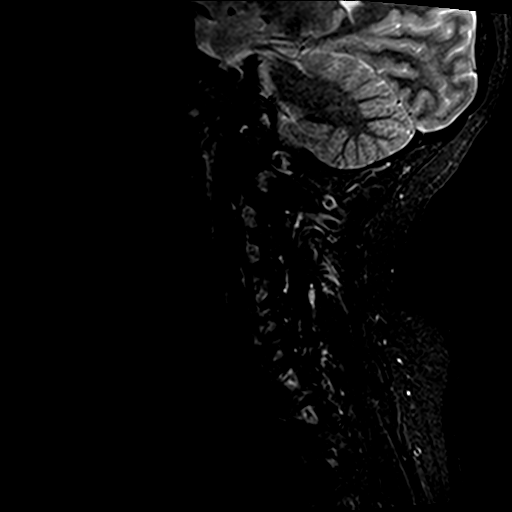
[im 5/13]
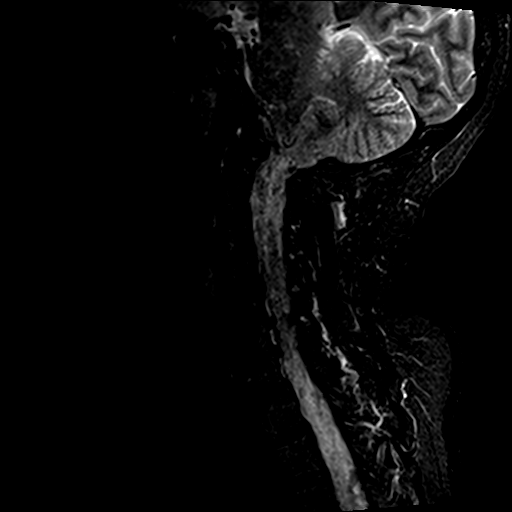
[im 8/13]
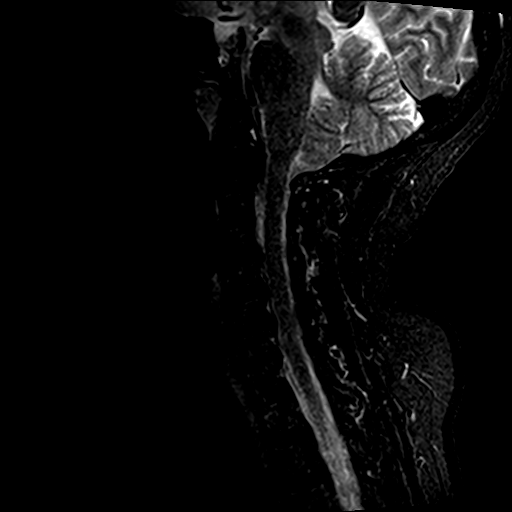
[im 10/13]
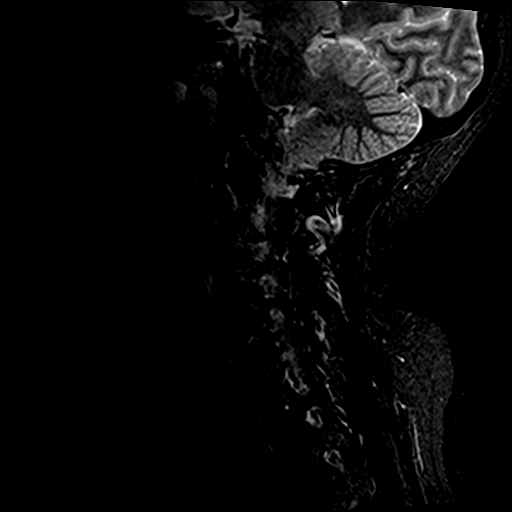
[im 13/13]
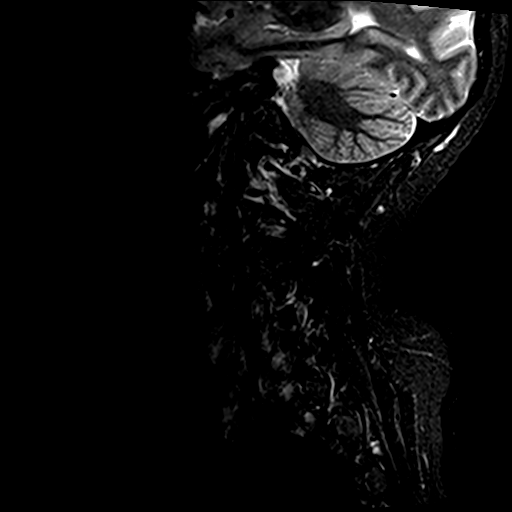

[Series 5: T2 · sagittal · 3.0mm · 0.66mm/px · 6 of 13 slices shown (1 of 2)]
[im 1/13]
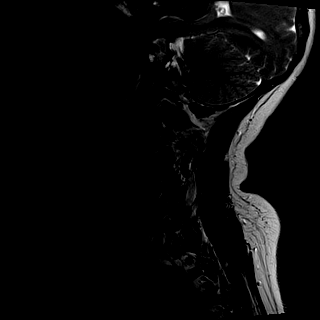
[im 3/13]
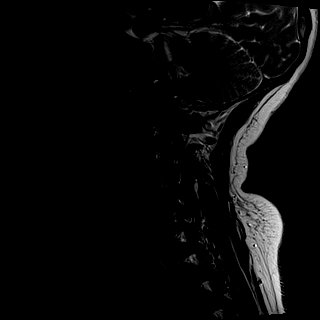
[im 5/13]
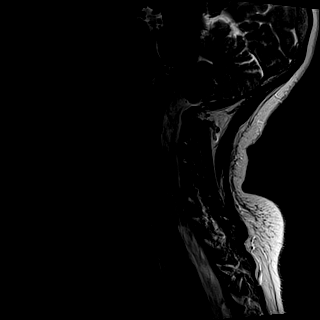
[im 8/13]
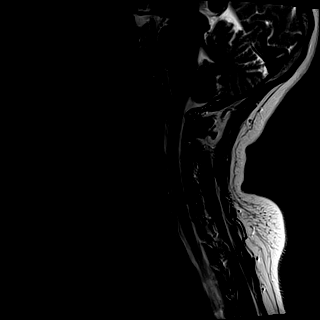
[im 10/13]
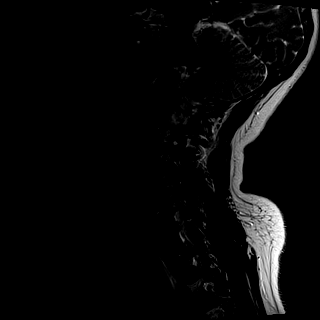
[im 13/13]
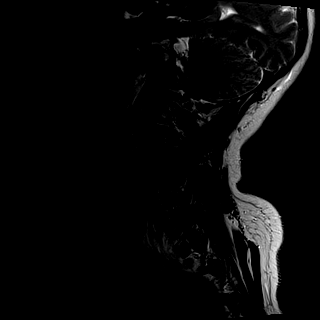

[Series 6: GRE · axial · 3.0mm · 0.35mm/px · 1 of 33 slices shown]
[im 1/33]
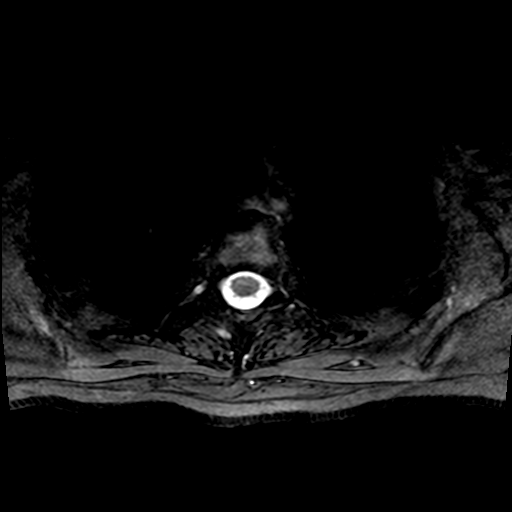

[Series 7: T2 · axial · 3.0mm · 0.70mm/px · z∈[-103,+14]mm · 9 of 33 slices shown (2 of 2)]
[im 1/33]
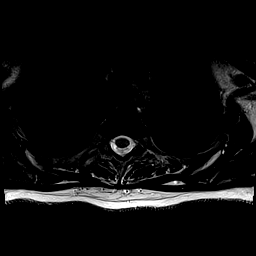
[im 5/33]
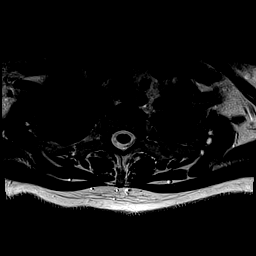
[im 10/33]
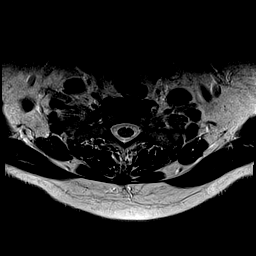
[im 14/33]
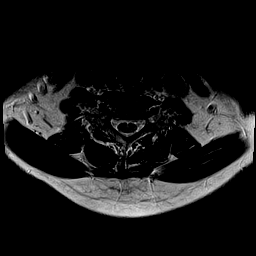
[im 17/33]
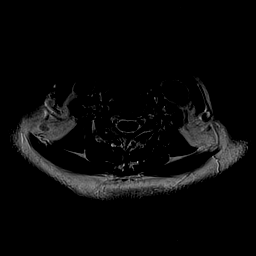
[im 19/33]
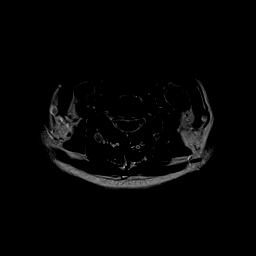
[im 23/33]
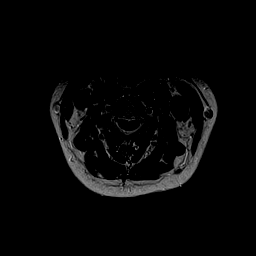
[im 28/33]
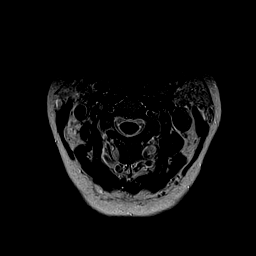
[im 33/33]
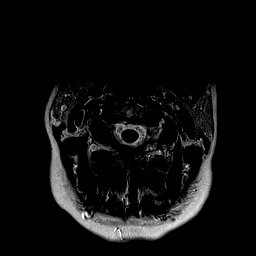

[28 of 48 positions shown; findings below may reference images not displayed]

FINDINGS: Alignment: AP alignment is anatomic. There is some straightening of
the normal cervical lordosis.

Vertebrae: A hemangioma is noted at C7. Mild endplate changes are
present at C5-6 and C6-7. Marrow signal and vertebral body heights
are otherwise normal.

Cord: Normal signal is present in the cervical and upper thoracic
spinal cord to the lowest imaged level, T2-3.

Posterior Fossa, vertebral arteries, paraspinal tissues: The
craniocervical junction is normal. Visualized intracranial contents
are normal. Flow is present in the vertebral artery is bilaterally.
Paraspinous soft tissues are unremarkable.

Disc levels:

C2-3:  Negative.

C3-4:  Negative.

C4-5: A shallow central disc protrusion is present. There is no
significant foraminal narrowing.

C5-6: A broad-based disc osteophyte complex is present.
Uncovertebral and facet disease results in moderate foraminal
stenosis bilaterally, worse on the right. There is partial
effacement of the ventral CSF.

C6-7: A rightward disc osteophyte complex is present. Moderate right
foraminal narrowing is secondary to uncovertebral disease. The left
foramen is patent.

C7-T1:  Negative.
IMPRESSION: 1. Moderate right foraminal narrowing at C5-6 and C6-7 due to
uncovertebral disease.
2. Slightly less prominent left moderate foraminal narrowing is
present at C5-6.
3. Partial effacement of the ventral CSF at C5-6. No other
significant central canal stenosis.

## 2018-03-23 ENCOUNTER — Telehealth: Payer: Self-pay | Admitting: Neurology

## 2018-03-23 ENCOUNTER — Other Ambulatory Visit: Payer: Self-pay | Admitting: *Deleted

## 2018-03-23 MED ORDER — AMITRIPTYLINE HCL 10 MG PO TABS
ORAL_TABLET | ORAL | 5 refills | Status: DC
Start: 2018-03-23 — End: 2018-10-16

## 2018-03-23 NOTE — Telephone Encounter (Signed)
Patient called regarding her Amitriptyline medication. She is getting ready to run out. She uses [facility] in [place]. She said her prescription has expired. Thanks

## 2018-03-23 NOTE — Telephone Encounter (Signed)
Refill sent.

## 2018-07-05 DIAGNOSIS — M359 Systemic involvement of connective tissue, unspecified: Secondary | ICD-10-CM | POA: Insufficient documentation

## 2018-07-21 DIAGNOSIS — D8989 Other specified disorders involving the immune mechanism, not elsewhere classified: Secondary | ICD-10-CM | POA: Insufficient documentation

## 2018-07-31 DIAGNOSIS — M359 Systemic involvement of connective tissue, unspecified: Secondary | ICD-10-CM | POA: Diagnosis not present

## 2018-08-01 DIAGNOSIS — E559 Vitamin D deficiency, unspecified: Secondary | ICD-10-CM | POA: Insufficient documentation

## 2018-08-29 DIAGNOSIS — R748 Abnormal levels of other serum enzymes: Secondary | ICD-10-CM | POA: Insufficient documentation

## 2018-09-01 DIAGNOSIS — K760 Fatty (change of) liver, not elsewhere classified: Secondary | ICD-10-CM | POA: Diagnosis not present

## 2018-09-12 ENCOUNTER — Other Ambulatory Visit: Payer: Self-pay | Admitting: Family Medicine

## 2018-09-12 DIAGNOSIS — Z1231 Encounter for screening mammogram for malignant neoplasm of breast: Secondary | ICD-10-CM

## 2018-10-16 ENCOUNTER — Other Ambulatory Visit: Payer: Self-pay | Admitting: Neurology

## 2018-10-31 ENCOUNTER — Ambulatory Visit
Admission: RE | Admit: 2018-10-31 | Discharge: 2018-10-31 | Disposition: A | Discharge status: Home / Self Care | Payer: BC Managed Care – PPO | Source: Ambulatory Visit | Attending: Family Medicine | Admitting: Family Medicine

## 2018-10-31 ENCOUNTER — Other Ambulatory Visit: Payer: Self-pay

## 2018-10-31 DIAGNOSIS — Z1231 Encounter for screening mammogram for malignant neoplasm of breast: Secondary | ICD-10-CM

## 2019-02-26 ENCOUNTER — Encounter: Payer: Self-pay | Admitting: Obstetrics and Gynecology

## 2019-02-26 ENCOUNTER — Ambulatory Visit (INDEPENDENT_AMBULATORY_CARE_PROVIDER_SITE_OTHER): Payer: BC Managed Care – PPO | Admitting: Obstetrics and Gynecology

## 2019-02-26 ENCOUNTER — Other Ambulatory Visit: Payer: Self-pay

## 2019-02-26 VITALS — BP 136/70 | HR 88 | Temp 97.3°F | Ht 59.0 in | Wt 142.0 lb

## 2019-02-26 DIAGNOSIS — Z01419 Encounter for gynecological examination (general) (routine) without abnormal findings: Secondary | ICD-10-CM

## 2019-02-26 DIAGNOSIS — Z124 Encounter for screening for malignant neoplasm of cervix: Secondary | ICD-10-CM | POA: Diagnosis not present

## 2019-02-26 DIAGNOSIS — N941 Unspecified dyspareunia: Secondary | ICD-10-CM | POA: Diagnosis not present

## 2019-02-26 DIAGNOSIS — N952 Postmenopausal atrophic vaginitis: Secondary | ICD-10-CM | POA: Diagnosis not present

## 2019-02-26 MED ORDER — ESTRADIOL 10 MCG VA TABS: ORAL_TABLET | VAGINAL | 0 refills | Status: DC

## 2019-02-26 NOTE — Patient Instructions (Addendum)
EXERCISE AND DIET:  We recommended that you start or continue a regular exercise program for good health. Regular exercise means any activity that makes your heart beat faster and makes you sweat.  We recommend exercising at least 30 minutes per day at least 3 days a week, preferably 4 or 5.  We also recommend a diet low in fat and sugar.  Inactivity, poor dietary choices and obesity can cause diabetes, heart attack, stroke, and kidney damage, among others.    ALCOHOL AND SMOKING:  Women should limit their alcohol intake to no more than 7 drinks/beers/glasses of wine (combined, not each!) per week. Moderation of alcohol intake to this level decreases your risk of breast cancer and liver damage. And of course, no recreational drugs are part of a healthy lifestyle.  And absolutely no smoking or even second hand smoke. Most people know smoking can cause heart and lung diseases, but did you know it also contributes to weakening of your bones? Aging of your skin?  Yellowing of your teeth and nails?  CALCIUM AND VITAMIN D:  Adequate intake of calcium and Vitamin D are recommended.  The recommendations for exact amounts of these supplements seem to change often, but generally speaking 1,200 mg of calcium (between diet and supplement) and 800 units of Vitamin D per day seems prudent. Certain women may benefit from higher intake of Vitamin D.  If you are among these women, your doctor will have told you during your visit.    PAP SMEARS:  Pap smears, to check for cervical cancer or precancers,  have traditionally been done yearly, although recent scientific advances have shown that most women can have pap smears less often.  However, every woman still should have a physical exam from her gynecologist every year. It will include a breast check, inspection of the vulva and vagina to check for abnormal growths or skin changes, a visual exam of the cervix, and then an exam to evaluate the size and shape of the uterus and  ovaries.  And after 59 years of age, a rectal exam is indicated to check for rectal cancers. We will also provide age appropriate advice regarding health maintenance, like when you should have certain vaccines, screening for sexually transmitted diseases, bone density testing, colonoscopy, mammograms, etc.   MAMMOGRAMS:  All women over 40 years old should have a yearly mammogram. Many facilities now offer a "3D" mammogram, which may cost around $50 extra out of pocket. If possible,  we recommend you accept the option to have the 3D mammogram performed.  It both reduces the number of women who will be called back for extra views which then turn out to be normal, and it is better than the routine mammogram at detecting truly abnormal areas.    COLON CANCER SCREENING: Now recommend starting at age 45. At this time colonoscopy is not covered for routine screening until 50. There are take home tests that can be done between 45-49.   COLONOSCOPY:  Colonoscopy to screen for colon cancer is recommended for all women at age 50.  We know, you hate the idea of the prep.  We agree, BUT, having colon cancer and not knowing it is worse!!  Colon cancer so often starts as a polyp that can be seen and removed at colonscopy, which can quite literally save your life!  And if your first colonoscopy is normal and you have no family history of colon cancer, most women don't have to have it again for   10 years.  Once every ten years, you can do something that may end up saving your life, right?  We will be happy to help you get it scheduled when you are ready.  Be sure to check your insurance coverage so you understand how much it will cost.  It may be covered as a preventative service at no cost, but you should check your particular policy.      Breast Self-Awareness Breast self-awareness means being familiar with how your breasts look and feel. It involves checking your breasts regularly and reporting any changes to your  health care provider. Practicing breast self-awareness is important. A change in your breasts can be a sign of a serious medical problem. Being familiar with how your breasts look and feel allows you to find any problems early, when treatment is more likely to be successful. All women should practice breast self-awareness, including women who have had breast implants. How to do a breast self-exam One way to learn what is normal for your breasts and whether your breasts are changing is to do a breast self-exam. To do a breast self-exam: Look for Changes  1. Remove all the clothing above your waist. 2. Stand in front of a mirror in a room with good lighting. 3. Put your hands on your hips. 4. Push your hands firmly downward. 5. Compare your breasts in the mirror. Look for differences between them (asymmetry), such as: ? Differences in shape. ? Differences in size. ? Puckers, dips, and bumps in one breast and not the other. 6. Look at each breast for changes in your skin, such as: ? Redness. ? Scaly areas. 7. Look for changes in your nipples, such as: ? Discharge. ? Bleeding. ? Dimpling. ? Redness. ? A change in position. Feel for Changes Carefully feel your breasts for lumps and changes. It is best to do this while lying on your back on the floor and again while sitting or standing in the shower or tub with soapy water on your skin. Feel each breast in the following way:  Place the arm on the side of the breast you are examining above your head.  Feel your breast with the other hand.  Start in the nipple area and make  inch (2 cm) overlapping circles to feel your breast. Use the pads of your three middle fingers to do this. Apply light pressure, then medium pressure, then firm pressure. The light pressure will allow you to feel the tissue closest to the skin. The medium pressure will allow you to feel the tissue that is a little deeper. The firm pressure will allow you to feel the tissue  close to the ribs.  Continue the overlapping circles, moving downward over the breast until you feel your ribs below your breast.  Move one finger-width toward the center of the body. Continue to use the  inch (2 cm) overlapping circles to feel your breast as you move slowly up toward your collarbone.  Continue the up and down exam using all three pressures until you reach your armpit.  Write Down What You Find  Write down what is normal for each breast and any changes that you find. Keep a written record with breast changes or normal findings for each breast. By writing this information down, you do not need to depend only on memory for size, tenderness, or location. Write down where you are in your menstrual cycle, if you are still menstruating. If you are having trouble noticing differences   in your breasts, do not get discouraged. With time you will become more familiar with the variations in your breasts and more comfortable with the exam. How often should I examine my breasts? Examine your breasts every month. If you are breastfeeding, the best time to examine your breasts is after a feeding or after using a breast pump. If you menstruate, the best time to examine your breasts is 5-7 days after your period is over. During your period, your breasts are lumpier, and it may be more difficult to notice changes. When should I see my health care provider? See your health care provider if you notice:  A change in shape or size of your breasts or nipples.  A change in the skin of your breast or nipples, such as a reddened or scaly area.  Unusual discharge from your nipples.  A lump or thick area that was not there before.  Pain in your breasts.  Anything that concerns you.   Atrophic Vaginitis  Atrophic vaginitis is a condition in which the tissues that line the vagina become dry and thin. This condition is most common in women who have stopped having regular menstrual periods (are in  menopause). This usually starts when a woman is 45-55 years old. That is the time when a woman's estrogen levels begin to drop (decrease). Estrogen is a female hormone. It helps to keep the tissues of the vagina moist. It stimulates the vagina to produce a clear fluid that lubricates the vagina for sexual intercourse. This fluid also protects the vagina from infection. Lack of estrogen can cause the lining of the vagina to get thinner and dryer. The vagina may also shrink in size. It may become less elastic. Atrophic vaginitis tends to get worse over time as a woman's estrogen level drops. What are the causes? This condition is caused by the normal drop in estrogen that happens around the time of menopause. What increases the risk? Certain conditions or situations may lower a woman's estrogen level, leading to a higher risk for atrophic vaginitis. You are more likely to develop this condition if:  You are taking medicines that block estrogen.  You have had your ovaries removed.  You are being treated for cancer with X-ray (radiation) or medicines (chemotherapy).  You have given birth or are breastfeeding.  You are older than age 50.  You smoke. What are the signs or symptoms? Symptoms of this condition include:  Pain, soreness, or bleeding during sexual intercourse (dyspareunia).  Vaginal burning, irritation, or itching.  Pain or bleeding when a speculum is used in a vaginal exam (pelvic exam).  Having burning pain when passing urine.  Vaginal discharge that is brown or yellow. In some cases, there are no symptoms. How is this diagnosed? This condition is diagnosed by taking a medical history and doing a physical exam. This will include a pelvic exam that checks the vaginal tissues. Though rare, you may also have other tests, including:  A urine test.  A test that checks the acid balance in your vagina (acid balance test). How is this treated? Treatment for this condition  depends on how severe your symptoms are. Treatment may include:  Using an over-the-counter vaginal lubricant before sex.  Using a long-acting vaginal moisturizer.  Using low-dose vaginal estrogen for moderate to severe symptoms that do not respond to other treatments. Options include creams, tablets, and inserts (vaginal rings). Before you use a vaginal estrogen, tell your health care provider if you have a history   of: ? Breast cancer. ? Endometrial cancer. ? Blood clots. If you are not sexually active and your symptoms are very mild, you may not need treatment. Follow these instructions at home: Medicines  Take over-the-counter and prescription medicines only as told by your health care provider. Do not use herbal or alternative medicines unless your health care provider says that you can.  Use over-the-counter creams, lubricants, or moisturizers for dryness only as directed by your health care provider. General instructions  If your atrophic vaginitis is caused by menopause, discuss all of your menopause symptoms and treatment options with your health care provider.  Do not douche.  Do not use products that can make your vagina dry. These include: ? Scented feminine sprays. ? Scented tampons. ? Scented soaps.  Vaginal intercourse can help to improve blood flow and elasticity of vaginal tissue. If it hurts to have sex, try using a lubricant or moisturizer just before having intercourse. Contact a health care provider if:  Your discharge looks different than normal.  Your vagina has an unusual smell.  You have new symptoms.  Your symptoms do not improve with treatment.  Your symptoms get worse. Summary  Atrophic vaginitis is a condition in which the tissues that line the vagina become dry and thin. It is most common in women who have stopped having regular menstrual periods (are in menopause).  Treatment options include using vaginal lubricants and low-dose vaginal  estrogen.  Contact a health care provider if your vagina has an unusual smell, or if your symptoms get worse or do not improve after treatment. This information is not intended to replace advice given to you by your health care provider. Make sure you discuss any questions you have with your health care provider. Document Released: 08/06/2014 Document Revised: 03/04/2017 Document Reviewed: 12/16/2016 Elsevier Patient Education  2020 [employer] Inc[employer]  

## 2019-02-26 NOTE — Progress Notes (Addendum)
58 y.o. [id number] Married White or Caucasian Not Hispanic or Latino female here for annual exam.   Intercourse is painful on entry. Lubricant not helping. Husband with h/o prostate cancer, some ED but improving. No vaginal bleeding. No bowel or bladder issues.     No LMP recorded. Patient is postmenopausal.          Sexually active: Yes.    The current method of family planning is post menopausal status.    Exercising: No.  The patient does not participate in regular exercise at present. Smoker:  Yes, vaping, used to smoke cigarette.   Health Maintenance: Pap:  2018 WNL History of abnormal Pap:  Yes abnormal, f/u testing okay. Not sure about hpv MMG:  10/31/2018 Birads 1 negative BMD:   Never Colonoscopy: 2015 WNL, done in [place] . F/U in 10 years. TDaP:  Up to date Gardasil: Never   reports that she has quit smoking. Her smoking use included cigarettes. She has never used smokeless tobacco. She reports that she does not drink alcohol or use drugs. 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 3 grown children, 2 in [place], one in [place]. No grandchildren.   Past Medical History:  Diagnosis Date  . Alpha galactosidase deficiency   . Paresthesias     Past Surgical History:  Procedure Laterality Date  . Bilateral ulnar nerve decompression    . CARPAL TUNNEL RELEASE    . CESAREAN SECTION    . DILATION AND CURETTAGE OF UTERUS    . HAND SURGERY     right hand  . LASIK    . rectal sphincter repair      Current Outpatient Medications  Medication Sig Dispense Refill  . amitriptyline (ELAVIL) 10 MG tablet TAKE TWO (2) TABLETS BY MOUTH AT BEDTIME 180 tablet 0  . cyclobenzaprine (FLEXERIL) 10 MG tablet TAKE ONE TABLET BY MOUTH AT BEDTIME.    Marland Kitchen FLUoxetine (PROZAC) 10 MG capsule Prozac 10 mg capsule  Take 1 capsule every day by oral route.    . hydroxychloroquine (PLAQUENIL) 200 MG tablet Take by mouth daily. Take 1.5 tablets daily.    Marland Kitchen OMEPRAZOLE PO  omeprazole 20 mg capsule,delayed release  Take 1 capsule every day by oral route in the morning for 30 days.    Marland Kitchen PROAIR HFA 108 (90 Base) MCG/ACT inhaler     . VITAMIN D PO Take by mouth.     No current facility-administered medications for this visit.     Family History  Problem Relation Age of Onset  . Graves' disease Mother   . Non-Hodgkin's lymphoma Father   . Diabetes Father   . Diabetes Paternal Uncle     Review of Systems  Constitutional: Negative.   HENT: Negative.   Eyes: Negative.   Respiratory: Negative.   Cardiovascular: Negative.   Gastrointestinal: Negative.   Endocrine: Negative.   Genitourinary: Positive for dyspareunia.  Musculoskeletal: Negative.   Skin: Negative.   Allergic/Immunologic: Negative.   Neurological: Negative.   Hematological: Negative.   Psychiatric/Behavioral: Negative.     Exam:   BP 136/70 (BP Location: Right Arm, Patient Position: Sitting, Cuff Size: Normal)   Pulse 88   Temp (!) 97.3 F (36.3 C) (Skin)   Ht 4\' 11"  (1.499 m)   Wt 142 lb (64.4 kg)   BMI 28.68 kg/m   Weight change: @WEIGHTCHANGE @ Height:   Height: 4\' 11"  (149.9 cm)  Ht Readings from Last 3 Encounters:  02/26/19 4\' 11"  (1.499 m)  05/13/17  4\' 11"  (1.499 m)    General appearance: alert, cooperative and appears stated age Head: Normocephalic, without obvious abnormality, atraumatic Neck: no adenopathy, supple, symmetrical, trachea midline and thyroid normal to inspection and palpation Lungs: clear to auscultation bilaterally Cardiovascular: regular rate and rhythm Breasts: normal appearance, no masses or tenderness Abdomen: soft, non-tender; non distended,  no masses,  no organomegaly Extremities: extremities normal, atraumatic, no cyanosis or edema Skin: Skin color, texture, turgor normal. No rashes or lesions Lymph nodes: Cervical, supraclavicular, and axillary nodes normal. No abnormal inguinal nodes palpated Neurologic: Grossly normal   Pelvic: External  genitalia:  no lesions              Urethra:  normal appearing urethra with no masses, tenderness or lesions              Bartholins and Skenes: normal                 Vagina: very atrophic appearing vagina with normal color and discharge, no lesions. Only able to insert one finger vaginally, uncomfortable with the pediatric speculum              Cervix: no lesions               Bimanual Exam:  Uterus:  normal size, contour, position, consistency, mobility, non-tender              Adnexa: no mass, fullness, tenderness               Rectovaginal: Confirms               Anus:  normal sphincter tone, no lesions  Chaperone was present for exam.  A:  Well Woman with normal exam  Vaginal atrophy  Dyspareunia  P:   Pap with hpv  Labs with primary  Mammogram and Colonoscopy are UTD  Discussed breast self exam  Discussed calcium and vit D intake  Start vaginal estrogen  F/U in one month  May need vaginal dilators   03/16/19 addendum: Records reviewed 7/17 negative pap and HPV

## 2019-03-05 ENCOUNTER — Other Ambulatory Visit (HOSPITAL_COMMUNITY)
Admission: RE | Admit: 2019-03-05 | Discharge: 2019-03-05 | Disposition: A | Discharge status: Home / Self Care | Payer: BC Managed Care – PPO | Source: Ambulatory Visit | Attending: Obstetrics and Gynecology | Admitting: Obstetrics and Gynecology

## 2019-03-05 DIAGNOSIS — Z124 Encounter for screening for malignant neoplasm of cervix: Secondary | ICD-10-CM | POA: Insufficient documentation

## 2019-03-05 NOTE — Addendum Note (Signed)
Addended by: Dorothy Spark on: 03/05/2019 12:02 PM   Modules accepted: Orders

## 2019-03-07 ENCOUNTER — Telehealth: Payer: Self-pay | Admitting: Obstetrics and Gynecology

## 2019-03-07 ENCOUNTER — Other Ambulatory Visit: Payer: Self-pay | Admitting: Neurology

## 2019-03-07 LAB — CYTOLOGY - PAP
Comment: NEGATIVE
Diagnosis: NEGATIVE
High risk HPV: NEGATIVE

## 2019-03-07 NOTE — Telephone Encounter (Signed)
Patient needs to be seen for further refills

## 2019-03-07 NOTE — Telephone Encounter (Signed)
The vaginal estrogen is supposed to help her pain with intercourse, she would likely need it as long as she plans to be sexually active. You can have her check on the cost of estrace or premarin cream as alternatives to the tablet. Other option is to call in compounded estradiol, the equivalent of 1 gram of estrace 2 x a week. 1 month supply, then she should f/u.  Yes her pap was normal.

## 2019-03-07 NOTE — Telephone Encounter (Signed)
Patient is calling to give information to Dr.Jertson's nurse. She also has a medication question.

## 2019-03-07 NOTE — Telephone Encounter (Signed)
Call to patient. Message given to patient as seen below from Dr. Talbert Nan and patient verbalized understanding. Patient states she will do her research on all of the different options and return call to update on what medication, if any, she decides to use.   Routing to provider and will close encounter.

## 2019-03-07 NOTE — Telephone Encounter (Signed)
Call to patient. Patient calling to give update on dosage of medications she is on. Each medication updated and read back provided from patient. See medication list in Epic. Patient also wanted to update Dr. Talbert Nan that she had an elevation in liver enzymes on blood work with Dr. Elvina Mattes. States she had a imaging of liver done at [facility] and was diagnosed with a minor case of fatty liver disease. RN advised would update Dr. Talbert Nan.   Patient also calling to ask for alternative for estradiol vaginal tablets. States with her insurance she will still have to pay $300/month or cash pay $165/month, but lose Chiropodist. RN advised patient of [email or web address] to compare pricing/coupon. Patient states she will look into it, but also interested in alternatives before she decides what to do. States she was unclear how long Dr. Talbert Nan wanted her to use the vaginal tablets and would like to know how long she anticipates her having to use them before patient decides if this is something she wants to financially invest in. RN advised would review with Dr. Talbert Nan and return call. Patient agreeable.   Patient also asking about pap results. RN advised pap smear results normal, but Dr. Talbert Nan still needed to review them and would advise if any additional recommendations. Patient agreeable.   Routing to provider for review.

## 2019-04-02 NOTE — Progress Notes (Deleted)
GYNECOLOGY  VISIT   HPI: 58 y.o.   Married White or Caucasian Not Hispanic or Latino  female   [phone] with No LMP recorded. Patient is postmenopausal.   here for     GYNECOLOGIC HISTORY: No LMP recorded. Patient is postmenopausal. Contraception:*** Menopausal hormone therapy: ***        OB History    Gravida  4   Para  3   Term  3   Preterm      AB  1   Living  3     SAB  1   TAB      Ectopic      Multiple      Live Births  3              Patient Active Problem List   Diagnosis Date Noted  . Elevated liver enzymes 08/29/2018  . Vitamin D deficiency 08/01/2018  . Autoimmune disorder (HCC) 07/21/2018  . Undifferentiated connective tissue disease (HCC) 07/05/2018  . Ulnar neuropathy of both upper extremities 05/13/2017  . Cervical stenosis of spinal canal 05/13/2017  . Anxiety 05/02/2017    Past Medical History:  Diagnosis Date  . Alpha galactosidase deficiency   . Paresthesias     Past Surgical History:  Procedure Laterality Date  . Bilateral ulnar nerve decompression    . CARPAL TUNNEL RELEASE    . CESAREAN SECTION    . DILATION AND CURETTAGE OF UTERUS    . HAND SURGERY     right hand  . LASIK    . rectal sphincter repair      Current Outpatient Medications  Medication Sig Dispense Refill  . amitriptyline (ELAVIL) 10 MG tablet TAKE TWO (2) TABLETS BY MOUTH AT BEDTIME 60 tablet 0  . cyclobenzaprine (FLEXERIL) 10 MG tablet TAKE ONE TABLET BY MOUTH AT BEDTIME.    . Estradiol 10 MCG TABS vaginal tablet Insert one tablet vaginally qhs x 7 days, then change to 2 x a week at hs. 24 tablet 0  . FLUoxetine (PROZAC) 10 MG capsule Prozac 10 mg capsule  Take 1 capsule every day by oral route.    . hydroxychloroquine (PLAQUENIL) 200 MG tablet Take by mouth daily. Take 1.5 tablets daily.    Marland Kitchen OMEPRAZOLE PO omeprazole 20 mg capsule,delayed release  Take 1 capsule every day by oral route in the morning for 30 days.    Marland Kitchen PROAIR HFA 108 (90 Base) MCG/ACT  inhaler     . VITAMIN D PO Take by mouth.     No current facility-administered medications for this visit.     ALLERGIES: Hydrocodone-acetaminophen  Family History  Problem Relation Age of Onset  . Graves' disease Mother   . Non-Hodgkin's lymphoma Father   . Diabetes Father   . Diabetes Paternal Uncle     Social History   Socioeconomic History  . Marital status: Married    Spouse name: Not on file  . Number of children: 3  . Years of education: 38  . Highest education level: Not on file  Occupational History  . Occupation: Runner, broadcasting/film/video  Tobacco Use  . Smoking status: Former Smoker    Types: Cigarettes  . Smokeless tobacco: Never Used  Substance and Sexual Activity  . Alcohol use: No  . Drug use: No  . Sexual activity: Yes    Birth control/protection: Post-menopausal  Other Topics Concern  . Not on file  Social History Narrative   Lives with husband in a one story home with  a basement.  Has 3 grown children.  Works as a Pharmacist, hospital.  Education: college.    Social Determinants of Health   Financial Resource Strain:   . Difficulty of Paying Living Expenses: Not on file  Food Insecurity:   . Worried About Charity fundraiser in the Last Year: Not on file  . Ran Out of Food in the Last Year: Not on file  Transportation Needs:   . Lack of Transportation (Medical): Not on file  . Lack of Transportation (Non-Medical): Not on file  Physical Activity:   . Days of Exercise per Week: Not on file  . Minutes of Exercise per Session: Not on file  Stress:   . Feeling of Stress : Not on file  Social Connections:   . Frequency of Communication with Friends and Family: Not on file  . Frequency of Social Gatherings with Friends and Family: Not on file  . Attends Religious Services: Not on file  . Active Member of Clubs or Organizations: Not on file  . Attends Archivist Meetings: Not on file  . Marital Status: Not on file  Intimate Partner Violence:   . Fear of Current or  Ex-Partner: Not on file  . Emotionally Abused: Not on file  . Physically Abused: Not on file  . Sexually Abused: Not on file    ROS  PHYSICAL EXAMINATION:    There were no vitals taken for this visit.    General appearance: alert, cooperative and appears stated age Neck: no adenopathy, supple, symmetrical, trachea midline and thyroid {CHL AMB PHY EX THYROID NORM DEFAULT:[phone]::"normal to inspection and palpation"} Breasts: {Exam; breast:13139::"normal appearance, no masses or tenderness"} Abdomen: soft, non-tender; non distended, no masses,  no organomegaly  Pelvic: External genitalia:  no lesions              Urethra:  normal appearing urethra with no masses, tenderness or lesions              Bartholins and Skenes: normal                 Vagina: normal appearing vagina with normal color and discharge, no lesions              Cervix: {CHL AMB PHY EX CERVIX NORM DEFAULT:[phone]::"no lesions"}              Bimanual Exam:  Uterus:  {CHL AMB PHY EX UTERUS NORM [phone]::"normal size, contour, position, consistency, mobility, non-tender"}              Adnexa: {CHL AMB PHY EX ADNEXA NO MASS [phone]::"no mass, fullness, tenderness"}              Rectovaginal: {yes no:314532}.  Confirms.              Anus:  normal sphincter tone, no lesions  Chaperone was present for exam.  ASSESSMENT     PLAN    An After Visit Summary was printed and given to the patient.  *** minutes face to face time of which over 50% was spent in counseling.

## 2019-04-04 ENCOUNTER — Encounter: Payer: Self-pay | Admitting: Obstetrics and Gynecology

## 2019-04-04 ENCOUNTER — Ambulatory Visit: Payer: BC Managed Care – PPO | Admitting: Obstetrics and Gynecology

## 2019-04-05 ENCOUNTER — Other Ambulatory Visit: Payer: Self-pay | Admitting: Neurology

## 2019-04-18 ENCOUNTER — Other Ambulatory Visit: Payer: Self-pay | Admitting: Neurology

## 2019-04-23 ENCOUNTER — Telehealth: Payer: Self-pay | Admitting: Neurology

## 2019-04-23 NOTE — Telephone Encounter (Signed)
Patient said she was unable to get her Amitriptyline medication filled? Please Call. Thank you

## 2019-04-23 NOTE — Telephone Encounter (Signed)
Line busy at 422 will call tomorrow

## 2019-04-24 NOTE — Telephone Encounter (Signed)
Spoke with Patient. She is scheduled for a vv next month. Thank you

## 2019-04-24 NOTE — Telephone Encounter (Signed)
Please call and offer patient a appointment, no refills till seen

## 2019-04-24 NOTE — Telephone Encounter (Signed)
Patient need to schedule follow-up visit. She was last seen in February 2019.  No additional refills until she has been evaluated. Alternatively, if she has seen her PCP more recently, she may request refills through their office.

## 2019-04-30 ENCOUNTER — Other Ambulatory Visit: Payer: Self-pay | Admitting: Neurology

## 2019-05-14 ENCOUNTER — Telehealth (INDEPENDENT_AMBULATORY_CARE_PROVIDER_SITE_OTHER): Payer: BC Managed Care – PPO | Admitting: Neurology

## 2019-05-14 ENCOUNTER — Other Ambulatory Visit: Payer: Self-pay

## 2019-05-14 ENCOUNTER — Encounter: Payer: Self-pay | Admitting: Neurology

## 2019-05-14 DIAGNOSIS — R519 Headache, unspecified: Secondary | ICD-10-CM | POA: Diagnosis not present

## 2019-05-14 MED ORDER — AMITRIPTYLINE HCL 10 MG PO TABS: 20.0000 mg | ORAL_TABLET | Freq: Every day | ORAL | 3 refills | Status: DC

## 2019-05-14 NOTE — Progress Notes (Signed)
   Due to the COVID-19 crisis, this virtual check-in visit was done via telephone from my office and it was initiated and consent given by this patient and or family.   Telephone (Audio) Visit The purpose of this telephone visit is to provide medical care while limiting exposure to the novel coronavirus.    Consent was obtained for telephone visit and initiated by pt/family:  Yes.   Answered questions that patient had about telehealth interaction:  Yes.   I discussed the limitations, risks, security and privacy concerns of performing an evaluation and management service by telephone. I also discussed with the patient that there may be a patient responsible charge related to this service. The patient expressed understanding and agreed to proceed.  Pt location: Home Physician Location: office Name of referring provider:  Joaquin Courts, DO I connected with .Bonnie Mckee at patients initiation/request on 05/14/2019 at  1:30 PM EST by telephone and verified that I am speaking with the correct person using two identifiers.  Pt MRN:  [id number] Pt DOB:  11/06/1960   History of Present Illness: This is a 59 year-old female returning for follow-up of chronic daily headaches.  She was last seen in February 2019 for bilateral ulnar neuropathy, cervical canal stenosis, and chronic headaches.  She underwent redo right ulnar nerve decompression which alleviated her hand paresthesias. Her headaches are very well-controlled on amitriptyline 20mg  at bedtime and she is due for refills.  She says that the whooshing sound in her ears is also markedly improved on amitriptyline. 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 CTA head performed elsewhere was normal.  She has since been diagnosed with connective tissue disease.     Assessment and Plan:   Chronic daily headaches, improved on amitriptyline  - Refills provided for amitriptyline 20mg  at bedtime  - Discussed that she may want  to request future refills for medication through her PCP's office, especially if there is no clinical or dose change.     Follow Up Instructions:   I discussed the assessment and treatment plan with the patient. The patient was provided an opportunity to ask questions and all were answered. The patient agreed with the plan and demonstrated an understanding of the instructions.   The patient was advised to call back or seek an in-person evaluation if the symptoms worsen or if the condition fails to improve as anticipated.    Total Time spent in visit with the patient was:  12 min, of which 100% of the time was spent in counseling and/or coordinating care.   Pt understands and agrees with the plan of care outlined.     , DO

## 2019-05-31 ENCOUNTER — Ambulatory Visit (INDEPENDENT_AMBULATORY_CARE_PROVIDER_SITE_OTHER): Payer: BC Managed Care – PPO

## 2019-05-31 ENCOUNTER — Ambulatory Visit (INDEPENDENT_AMBULATORY_CARE_PROVIDER_SITE_OTHER): Payer: BC Managed Care – PPO | Admitting: Podiatry

## 2019-05-31 ENCOUNTER — Other Ambulatory Visit: Payer: Self-pay

## 2019-05-31 DIAGNOSIS — M7662 Achilles tendinitis, left leg: Secondary | ICD-10-CM

## 2019-05-31 DIAGNOSIS — M2682 Posterior soft tissue impingement: Secondary | ICD-10-CM | POA: Diagnosis not present

## 2019-05-31 NOTE — Progress Notes (Signed)
Subjective:  Patient ID: Bonnie Mckee, female    DOB: 15-Sep-1960,  MRN: [id number] HPI Chief Complaint  Patient presents with  . New Patient (Initial Visit)  . Foot Injury    fell last ocotber and landed on left foot in achilles area. constant pain , has caused limited mobility     59 y.o. female presents with the above complaint.   ROS: Denies fever chills nausea vomiting muscle aches pains calf pain back pain chest pain shortness of breath.  Past Medical History:  Diagnosis Date  . Alpha galactosidase deficiency   . Paresthesias    Past Surgical History:  Procedure Laterality Date  . Bilateral ulnar nerve decompression    . CARPAL TUNNEL RELEASE    . CESAREAN SECTION    . DILATION AND CURETTAGE OF UTERUS    . HAND SURGERY     right hand  . LASIK    . rectal sphincter repair      Current Outpatient Medications:  .  amitriptyline (ELAVIL) 10 MG tablet, Take 2 tablets (20 mg total) by mouth at bedtime., Disp: 180 tablet, Rfl: 3 .  FLUoxetine (PROZAC) 10 MG capsule, Prozac 10 mg capsule  Take 1 capsule every day by oral route., Disp: , Rfl:  .  gabapentin (NEURONTIN) 600 MG tablet, gabapentin 600 mg tablet, Disp: , Rfl:  .  hydroxychloroquine (PLAQUENIL) 200 MG tablet, Take by mouth daily. Take 1.5 tablets daily., Disp: , Rfl:  .  hydrOXYzine (ATARAX/VISTARIL) 25 MG tablet, Take 25 mg by mouth daily as needed., Disp: , Rfl:  .  ketorolac (TORADOL) 30 MG/ML injection, ketorolac 30 mg/mL injection solution  Inject 1 mL every 6 hours by intravenous route., Disp: , Rfl:  .  PROAIR HFA 108 (90 Base) MCG/ACT inhaler, , Disp: , Rfl:  .  VITAMIN D PO, Take by mouth., Disp: , Rfl:   Allergies  Allergen Reactions  . Hydrocodone-Acetaminophen Itching  . Alpha-Gal     Diarrhea, stomach issues   Review of Systems Objective:  There were no vitals filed for this visit.  General: Well developed, nourished, in no acute distress, alert and oriented x3   Dermatological: Skin is warm,  dry and supple bilateral. Nails x 10 are well maintained; remaining integument appears unremarkable at this time. There are no open sores, no preulcerative lesions, no rash or signs of infection present.  Vascular: Dorsalis Pedis artery and Posterior Tibial artery pedal pulses are 2/4 bilateral with immedate capillary fill time. Pedal hair growth present. No varicosities and no lower extremity edema present bilateral.   Neruologic: Grossly intact via light touch bilateral. Vibratory intact via tuning fork bilateral. Protective threshold with Semmes Wienstein monofilament intact to all pedal sites bilateral. Patellar and Achilles deep tendon reflexes 2+ bilateral. No Babinski or clonus noted bilateral.   Musculoskeletal: No gross boney pedal deformities bilateral. No pain, crepitus, or limitation noted with foot and ankle range of motion bilateral. Muscular strength 5/5 in all groups tested bilateral.  She has pain on palpation of the deep posterior compartment of Cager's triangle sharp dorsiflexion and plantarflexion.  Gait: Unassisted, Nonantalgic.    Radiographs:  Radiographs taken today do not demonstrate any type of osseous abnormality some soft tissue swelling in the tainter triangle area Achilles appears to be intact.  No fracture  posterior process of the talus is identified and no os trigonum.  Evaluated MRI and MRI result which demonstrate cyst to the deep posterior compartment of the ankle.  Assessment & Plan:  Assessment: Most likely ganglion cyst or some cystic problem in the posterior aspect of the ankle left.  At this point probable ganglion cyst.  Plan: I injected the area today 20 mg Kenalog 5 mg Marcaine to the point of maximal tenderness.  Also sent in out of system MRI for an over read.  I will follow-up with her in about a month     Georgia Delsignore T. [place], [place]

## 2019-06-04 DIAGNOSIS — M359 Systemic involvement of connective tissue, unspecified: Secondary | ICD-10-CM | POA: Diagnosis not present

## 2019-06-05 ENCOUNTER — Telehealth: Payer: Self-pay | Admitting: *Deleted

## 2019-06-05 NOTE — Telephone Encounter (Signed)
Mailed copy of MRI disc from [facility] [place], [place], may not be printed in [employer].

## 2019-06-07 DIAGNOSIS — Z23 Encounter for immunization: Secondary | ICD-10-CM | POA: Diagnosis not present

## 2019-06-13 DIAGNOSIS — D8989 Other specified disorders involving the immune mechanism, not elsewhere classified: Secondary | ICD-10-CM | POA: Insufficient documentation

## 2019-06-13 DIAGNOSIS — G2581 Restless legs syndrome: Secondary | ICD-10-CM | POA: Insufficient documentation

## 2019-06-21 ENCOUNTER — Ambulatory Visit: Payer: BC Managed Care – PPO | Admitting: Neurology

## 2019-06-28 ENCOUNTER — Ambulatory Visit: Payer: BC Managed Care – PPO | Admitting: Podiatry

## 2019-07-05 DIAGNOSIS — Z23 Encounter for immunization: Secondary | ICD-10-CM | POA: Diagnosis not present

## 2019-07-12 ENCOUNTER — Encounter: Payer: Self-pay | Admitting: Podiatry

## 2019-07-12 ENCOUNTER — Telehealth: Payer: Self-pay | Admitting: *Deleted

## 2019-07-12 ENCOUNTER — Ambulatory Visit (INDEPENDENT_AMBULATORY_CARE_PROVIDER_SITE_OTHER): Payer: BC Managed Care – PPO | Admitting: Podiatry

## 2019-07-12 ENCOUNTER — Other Ambulatory Visit: Payer: Self-pay

## 2019-07-12 DIAGNOSIS — M7752 Other enthesopathy of left foot: Secondary | ICD-10-CM

## 2019-07-12 DIAGNOSIS — M7662 Achilles tendinitis, left leg: Secondary | ICD-10-CM

## 2019-07-12 NOTE — Telephone Encounter (Signed)
Bonnie Mckee states she will fax to [phone] now.

## 2019-07-12 NOTE — Progress Notes (Signed)
She presents today for follow-up of her tendinitis of her deep posterior ankle and subtalar joint area laterally left foot.  She states that she got 100% better then regressed to about 70% and now has improved to about 80 to 85%.  She states is just a dull ache.  Objective: There is no erythema edema cellulitis drainage or odor she still has pain deep into the posterior subtalar joint and ankle area.  Over read of the MRI does demonstrate a capsular preload prolapse at the subtalar joint.  Assessment: Capsulitis.  Posterior lateral  Plan: Discussed etiology pathology and surgical therapies at this point time I injected 10 mg of dexamethasone and local anesthetic point of maximal tenderness.  I think this should alleviate her symptoms entirely should any recur or should she does not relieve her symptoms she will notify us immediately.  Otherwise follow-up with her as needed.

## 2019-09-18 DIAGNOSIS — F411 Generalized anxiety disorder: Secondary | ICD-10-CM | POA: Insufficient documentation

## 2019-10-11 ENCOUNTER — Other Ambulatory Visit: Payer: Self-pay | Admitting: Family Medicine

## 2019-10-11 DIAGNOSIS — Z1231 Encounter for screening mammogram for malignant neoplasm of breast: Secondary | ICD-10-CM

## 2019-11-06 ENCOUNTER — Other Ambulatory Visit: Payer: Self-pay

## 2019-11-06 ENCOUNTER — Ambulatory Visit
Admission: RE | Admit: 2019-11-06 | Discharge: 2019-11-06 | Disposition: A | Discharge status: Home / Self Care | Payer: BC Managed Care – PPO | Source: Ambulatory Visit | Attending: Family Medicine | Admitting: Family Medicine

## 2019-11-06 DIAGNOSIS — Z1231 Encounter for screening mammogram for malignant neoplasm of breast: Secondary | ICD-10-CM

## 2019-11-12 ENCOUNTER — Ambulatory Visit: Payer: BC Managed Care – PPO

## 2020-01-17 ENCOUNTER — Encounter: Payer: Self-pay | Admitting: Podiatry

## 2020-01-17 ENCOUNTER — Telehealth: Payer: Self-pay | Admitting: Podiatry

## 2020-01-17 ENCOUNTER — Ambulatory Visit (INDEPENDENT_AMBULATORY_CARE_PROVIDER_SITE_OTHER): Payer: BC Managed Care – PPO | Admitting: Podiatry

## 2020-01-17 ENCOUNTER — Other Ambulatory Visit: Payer: Self-pay

## 2020-01-17 DIAGNOSIS — M7752 Other enthesopathy of left foot: Secondary | ICD-10-CM

## 2020-01-17 MED ORDER — METHYLPREDNISOLONE 4 MG PO TBPK: ORAL_TABLET | ORAL | 0 refills | Status: DC

## 2020-01-17 NOTE — Telephone Encounter (Signed)
Patient said she is in a lot of pain since getting her injection. It hurts to even touch her ankle. She just wanted to make sure this is normal.

## 2020-01-17 NOTE — Telephone Encounter (Signed)
Spoke to patient and advised. She is very relieved. She has been Icing and it's already much better. Advised to please call back with any further questions or problems.

## 2020-01-17 NOTE — Telephone Encounter (Signed)
Per Dr. Al Corpus -   Most likely having a steroid flare, but also there is a nerve near the area injected. This is normal. Ice as much a you can over the next few days until increased pain subsides.

## 2020-01-17 NOTE — Progress Notes (Signed)
She presents today for follow-up of her capsulitis of her posterior ankle and subtalar joint states this really same spot down deep and there once again when I push my foot down it really pinches down in the back of my heel.  She states that I am limping and starting to make my whole foot hurt particularly on the top.  Objective: Vital signs are stable she is alert and oriented x3.  She has pain with sharp plantar flexion as well as dorsiflexion and dorsiflexion of the hallux.  This is very much similar to a os trigonum syndrome in appearance.  She does not have a lot of tenderness on palpation of the Achilles there and there is no fluctuance in this area.  Assessment: Posterior impingement of the ankle and subtalar joint.  Compensatory pain across the dorsum of the foot left.  Plan: Discussed etiology pathology conservative surgical therapy start her on methylprednisolone provided her information regarding Voltaren gel and I injected the posterior ankle and subtalar joint with 20 mg of Kenalog 5 mg Marcaine point of maximal tenderness.  Tolerated procedure well without complications we will follow-up with her in 6 to 8 weeks.  May need to consider reimaging if she is not improving.

## 2020-03-03 NOTE — Progress Notes (Deleted)
59 y.o. [id number] Married White or Caucasian Not Hispanic or Latino female here for annual exam.      No LMP recorded. Patient is postmenopausal.          Sexually active: {yes no:314532}  The current method of family planning is {contraception:315051}.    Exercising: {yes no:314532}  {types:19826} Smoker:  {YES NO:22349}  Health Maintenance: Pap: 03/05/19 Wnl HR HPV Neg,  2018 WNL  History of abnormal Pap:  {YES NO:22349} MMG:  11/08/19 density B Bi-rads 1 neg  BMD:   Never  Colonoscopy:2015 WNL, done in [place]. F/U in 10 years.  TDaP:  Up to date  Gardasil: Never    reports that she has quit smoking. Her smoking use included cigarettes. She has never used smokeless tobacco. She reports that she does not drink alcohol and does not use drugs.  Past Medical History:  Diagnosis Date  . Alpha galactosidase deficiency   . Paresthesias     Past Surgical History:  Procedure Laterality Date  . Bilateral ulnar nerve decompression    . CARPAL TUNNEL RELEASE    . CESAREAN SECTION    . DILATION AND CURETTAGE OF UTERUS    . HAND SURGERY     right hand  . LASIK    . rectal sphincter repair      Current Outpatient Medications  Medication Sig Dispense Refill  . amitriptyline (ELAVIL) 10 MG tablet Take 2 tablets (20 mg total) by mouth at bedtime. 180 tablet 3  . amoxicillin-clavulanate (AUGMENTIN) 875-125 MG tablet amoxicillin 875 mg-potassium clavulanate 125 mg tablet  TAKE ONE TABLET BY MOUTH EVERY TWELVE HOURS FOR 10 DAYS.    Marland Kitchen diazepam (VALIUM) 5 MG tablet SMARTSIG:2 Tablet(s) By Mouth    . EPINEPHrine 0.3 mg/0.3 mL IJ SOAJ injection Inject into the muscle as directed.    Marland Kitchen FLUoxetine (PROZAC) 10 MG capsule Prozac 10 mg capsule  Take 1 capsule every day by oral route.    Marland Kitchen FLUoxetine (PROZAC) 10 MG tablet Take 10 mg by mouth daily.    Marland Kitchen gabapentin (NEURONTIN) 600 MG tablet gabapentin 600 mg tablet    . hydroxychloroquine (PLAQUENIL) 200 MG tablet Take by mouth daily. Take 1.5  tablets daily.    . hydrOXYzine (ATARAX/VISTARIL) 25 MG tablet Take 25 mg by mouth daily as needed.    Marland Kitchen ibuprofen (ADVIL) 800 MG tablet Take 800 mg by mouth 3 (three) times daily as needed.    Marland Kitchen ketorolac (TORADOL) 30 MG/ML injection ketorolac 30 mg/mL injection solution  Inject 1 mL every 6 hours by intravenous route.    . methylPREDNISolone (MEDROL DOSEPAK) 4 MG TBPK tablet 6 day dose pack - take as directed 21 tablet 0  . pregabalin (LYRICA) 75 MG capsule Lyrica 75 mg capsule  Take 1 capsule twice a day by oral route.    Marland Kitchen PROAIR HFA 108 (90 Base) MCG/ACT inhaler     . rOPINIRole (REQUIP) 0.25 MG tablet Take 0.25 mg by mouth 3 (three) times daily.    Marland Kitchen VITAMIN D PO Take by mouth.     No current facility-administered medications for this visit.    Family History  Problem Relation Age of Onset  . Graves' disease Mother   . Non-Hodgkin's lymphoma Father   . Diabetes Father   . Diabetes Paternal Uncle     Review of Systems  Exam:   There were no vitals taken for this visit.  Weight change: @WEIGHTCHANGE @ Height:      Ht Readings  from Last 3 Encounters:  05/11/19 4\' 11"  (1.499 m)  02/26/19 4\' 11"  (1.499 m)  05/13/17 4\' 11"  (1.499 m)    General appearance: alert, cooperative and appears stated age Head: Normocephalic, without obvious abnormality, atraumatic Neck: no adenopathy, supple, symmetrical, trachea midline and thyroid {CHL AMB PHY EX THYROID NORM DEFAULT:[phone]::"normal to inspection and palpation"} Lungs: clear to auscultation bilaterally Cardiovascular: regular rate and rhythm Breasts: {Exam; breast:13139::"normal appearance, no masses or tenderness"} Abdomen: soft, non-tender; non distended,  no masses,  no organomegaly Extremities: extremities normal, atraumatic, no cyanosis or edema Skin: Skin color, texture, turgor normal. No rashes or lesions Lymph nodes: Cervical, supraclavicular, and axillary nodes normal. No abnormal inguinal nodes palpated Neurologic:  Grossly normal   Pelvic: External genitalia:  no lesions              Urethra:  normal appearing urethra with no masses, tenderness or lesions              Bartholins and Skenes: normal                 Vagina: normal appearing vagina with normal color and discharge, no lesions              Cervix: {CHL AMB PHY EX CERVIX NORM [phone]::"no lesions"}               Bimanual Exam:  Uterus:  {CHL AMB PHY EX UTERUS NORM DEFAULT:[phone]::"normal size, contour, position, consistency, mobility, non-tender"}              Adnexa: {CHL AMB PHY EX ADNEXA NO MASS DEFAULT:[phone]::"no mass, fullness, tenderness"}               Rectovaginal: Confirms               Anus:  normal sphincter tone, no lesions  *** chaperoned for the exam.  A:  Well Woman with normal exam  P:

## 2020-03-05 ENCOUNTER — Encounter: Payer: Self-pay | Admitting: Obstetrics and Gynecology

## 2020-03-05 ENCOUNTER — Ambulatory Visit: Payer: BC Managed Care – PPO | Admitting: Obstetrics and Gynecology

## 2020-03-13 ENCOUNTER — Ambulatory Visit: Payer: BC Managed Care – PPO

## 2020-03-13 ENCOUNTER — Ambulatory Visit (INDEPENDENT_AMBULATORY_CARE_PROVIDER_SITE_OTHER): Payer: BC Managed Care – PPO | Admitting: Podiatry

## 2020-03-13 ENCOUNTER — Encounter: Payer: Self-pay | Admitting: Podiatry

## 2020-03-13 ENCOUNTER — Other Ambulatory Visit: Payer: Self-pay

## 2020-03-13 DIAGNOSIS — M7752 Other enthesopathy of left foot: Secondary | ICD-10-CM | POA: Diagnosis not present

## 2020-03-13 DIAGNOSIS — M2682 Posterior soft tissue impingement: Secondary | ICD-10-CM | POA: Diagnosis not present

## 2020-03-13 DIAGNOSIS — S86012A Strain of left Achilles tendon, initial encounter: Secondary | ICD-10-CM | POA: Diagnosis not present

## 2020-03-13 NOTE — Progress Notes (Signed)
She presents today with her husband for follow-up of her capsulitis of the posterior ankle left.  States that it is very intermittent as to where it used to go away now it is back and seems to be getting worse she points to her Achilles and states this hurts sometimes but the rest of it is deep as she points to the posterior ankle.  Objective: Vital signs are stable she is alert and oriented x3.  Pulses are palpable.  With plantar flexion of the foot pain is increased in the posterior ankle and subtalar joint area.  She has no tenderness on palpation of the Achilles at this point.  Assessment: Posterior impingement syndrome of the ankle most likely cannot rule out Achilles tendon tear.  Plan: At this point we will request MRI to rule out a worsening tear from previous MRI.  Were also going to reevaluate the posterior aspect of the ankle for surgical consideration.

## 2020-03-20 ENCOUNTER — Ambulatory Visit: Payer: BC Managed Care – PPO | Admitting: Podiatry

## 2020-03-27 ENCOUNTER — Other Ambulatory Visit: Payer: Self-pay

## 2020-03-27 ENCOUNTER — Ambulatory Visit
Admission: RE | Admit: 2020-03-27 | Discharge: 2020-03-27 | Disposition: A | Discharge status: Home / Self Care | Payer: BC Managed Care – PPO | Source: Ambulatory Visit | Attending: Podiatry | Admitting: Podiatry

## 2020-03-27 DIAGNOSIS — M2682 Posterior soft tissue impingement: Secondary | ICD-10-CM

## 2020-03-27 DIAGNOSIS — M7662 Achilles tendinitis, left leg: Secondary | ICD-10-CM | POA: Diagnosis not present

## 2020-03-27 DIAGNOSIS — S86012A Strain of left Achilles tendon, initial encounter: Secondary | ICD-10-CM

## 2020-03-27 DIAGNOSIS — M722 Plantar fascial fibromatosis: Secondary | ICD-10-CM | POA: Diagnosis not present

## 2020-03-27 DIAGNOSIS — S86312A Strain of muscle(s) and tendon(s) of peroneal muscle group at lower leg level, left leg, initial encounter: Secondary | ICD-10-CM | POA: Diagnosis not present

## 2020-03-27 DIAGNOSIS — M65872 Other synovitis and tenosynovitis, left ankle and foot: Secondary | ICD-10-CM | POA: Diagnosis not present

## 2020-04-07 ENCOUNTER — Ambulatory Visit: Payer: BC Managed Care – PPO | Admitting: Podiatry

## 2020-04-23 ENCOUNTER — Ambulatory Visit (INDEPENDENT_AMBULATORY_CARE_PROVIDER_SITE_OTHER): Payer: BC Managed Care – PPO | Admitting: Podiatry

## 2020-04-23 ENCOUNTER — Encounter: Payer: Self-pay | Admitting: Podiatry

## 2020-04-23 ENCOUNTER — Other Ambulatory Visit: Payer: Self-pay

## 2020-04-23 DIAGNOSIS — M7672 Peroneal tendinitis, left leg: Secondary | ICD-10-CM

## 2020-04-23 NOTE — Progress Notes (Signed)
She presents today for follow-up of her MRI she states that the left foot is still moderately painful she states plantarly is painful as well as deep posterior lateral aspect.  States that the Achilles tendon is really not doing too badly at all.  Objective: Vital signs are stable she is alert and oriented x3.  Pulses are strongly palpable.  Much decrease in edema no erythema cellulitis drainage or odor though she does have pain on palpation of the peroneus brevis proximal to the lateral malleolus.  She also has pain on palpation of the central band of the plantar fascia left.  The majority of her pain is located deep along the peroneal groove of the fibula.  MRI does relate a significant tear of the peroneus brevis along the proximal margin.  Also demonstrates chronic Achilles tendinosis as well as Planter fasciitis of the central cord.  Assessment: Planter fasciitis peroneal tendon tear.  Plan: At this point consented her today for surgical intervention consisting of primary repair of the peroneus brevis tendon as well as an endoscopic plantar fasciotomy and cast application.  We did discuss the possible postop complications which may include but are not limited to postop pain bleeding swelling infection recurrence need for further surgery overcorrection under correction also digit loss of limb loss of life.  Also offered her the opportunity to go ahead and try physical therapy prior to surgery she declined.  We discussed her past medical history medications allergies surgeries and social history she is allergic to codeine and Alphagan.  Provided her with information regarding narcotics and discussed the use of the narcotics in great detail today.  She understands that she will have them for a limited period of time we did discuss some alternatives.  Provided her with information regarding the surgery center anesthesia group and information for the morning of surgery.  I will follow-up with her in  the near future for surgical intervention.

## 2020-04-25 ENCOUNTER — Telehealth: Payer: Self-pay

## 2020-04-25 NOTE — Telephone Encounter (Signed)
Received surgery paperwork from the [place] office. Left message for Nahal to call back to schedule surgery.

## 2020-08-04 NOTE — Progress Notes (Signed)
60 y.o. [id number] Married White or Caucasian Not Hispanic or Latino female here for annual exam.   Patient has had trouble with loose bowels since January. She has lots of food allergies. She hasn't had a regular BM since January.  Some days she doesn't have a BM, when she has a BM it is loose to liquid. More liquid than anything, wearing depends just in case.  She has seen GI and is on probiotics, it has helped some.  Last year she c/o dyspareunia. She was given a script for vaginal estrogen and vaginal dilators were discussed. She and her husband decided to be sexually active without penetration and it'ss working for them.   No bladder c/o.     No LMP recorded. Patient is postmenopausal.          Sexually active: No.  The current method of family planning is post menopausal status.    Exercising: Yes.    The patient has a physically strenuous job, but has no regular exercise apart from work. On her feet at work and walks a lot.  Smoker:  Yes vaping, used to smoke cigarette   Health Maintenance: Pap: 03/05/2019 WNL Hr HPV Neg,  2018 WNL History of abnormal Pap:  Yesf/u testing okay. Not sure about hpv  MMG:  11/06/19 density B Bi-rads 1 neg  BMD:   Never  Colonoscopy:  2015 WNL, done in [place]. F/U in 10 years. TDaP:  Up to date per patient  Gardasil: NA   reports that she has quit smoking. Her smoking use included cigarettes. She has never used smokeless tobacco. She reports that she does not drink alcohol and does not use drugs. 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 3 grown children, 2 in [place], one in [place]. No grandchildren.   Past Medical History:  Diagnosis Date  . Alpha galactosidase deficiency   . Paresthesias     Past Surgical History:  Procedure Laterality Date  . Bilateral ulnar nerve decompression    . CARPAL TUNNEL RELEASE    . CESAREAN SECTION    . DILATION AND CURETTAGE OF UTERUS    . HAND SURGERY     right hand  . LASIK    .  rectal sphincter repair      Current Outpatient Medications  Medication Sig Dispense Refill  . amitriptyline (ELAVIL) 10 MG tablet Take 2 tablets (20 mg total) by mouth at bedtime. 180 tablet 3  . cyclobenzaprine (FLEXERIL) 10 MG tablet cyclobenzaprine 10 mg tablet  TAKE ONE TABLET BY MOUTH TWICE DAILY AS NEEDED    . diazepam (VALIUM) 5 MG tablet SMARTSIG:2 Tablet(s) By Mouth    . EPINEPHrine 0.3 mg/0.3 mL IJ SOAJ injection Inject into the muscle as directed.    Marland Kitchen FLUoxetine (PROZAC) 10 MG capsule Prozac 10 mg capsule  Take 1 capsule every day by oral route.    . gabapentin (NEURONTIN) 600 MG tablet gabapentin 600 mg tablet    . hydroxychloroquine (PLAQUENIL) 200 MG tablet Take by mouth daily. Take 1.5 tablets daily.    . hydrOXYzine (ATARAX/VISTARIL) 25 MG tablet Take 25 mg by mouth daily as needed.    Marland Kitchen ibuprofen (ADVIL) 800 MG tablet Take 800 mg by mouth 3 (three) times daily as needed.    Marland Kitchen PROAIR HFA 108 (90 Base) MCG/ACT inhaler     . Probiotic Product (PROBIOTIC PO) Take by mouth.    Marland Kitchen rOPINIRole (REQUIP) 0.25 MG tablet Take 0.25 mg by mouth 3 (three)  times daily.     No current facility-administered medications for this visit.    Family History  Problem Relation Age of Onset  . Graves' disease Mother   . Non-Hodgkin's lymphoma Father   . Diabetes Father   . Diabetes Paternal Uncle     Review of Systems  Gastrointestinal: Positive for diarrhea.  All other systems reviewed and are negative.   Exam:   BP 122/66   Pulse 92   Ht 4\' 11"  (1.499 m)   Wt 126 lb (57.2 kg)   SpO2 99%   BMI 25.45 kg/m   Weight change: @WEIGHTCHANGE @ Height:   Height: 4\' 11"  (149.9 cm)  Ht Readings from Last 3 Encounters:  08/07/20 4\' 11"  (1.499 m)  05/11/19 4\' 11"  (1.499 m)  02/26/19 4\' 11"  (1.499 m)    General appearance: alert, cooperative and appears stated age Head: Normocephalic, without obvious abnormality, atraumatic Neck: no adenopathy, supple, symmetrical, trachea midline and  thyroid normal to inspection and palpation Lungs: clear to auscultation bilaterally Cardiovascular: regular rate and rhythm Breasts: normal appearance, no masses or tenderness Abdomen: soft, non-tender; non distended,  no masses,  no organomegaly Extremities: extremities normal, atraumatic, no cyanosis or edema Skin: Skin color, texture, turgor normal. No rashes or lesions Lymph nodes: Cervical, supraclavicular, and axillary nodes normal. No abnormal inguinal nodes palpated Neurologic: Grossly normal   Pelvic: External genitalia:  no lesions              Urethra:  normal appearing urethra with no masses, tenderness or lesions              Bartholins and Skenes: normal                 Vagina: very atrophic appearing vagina with normal color and discharge, no lesions              Cervix: no lesions               Bimanual Exam:  Uterus:  normal size, contour, position, consistency, mobility, non-tender              Adnexa: no mass, fullness, tenderness               Rectovaginal: Confirms               Anus:  normal sphincter tone, no lesions  10/07/20 chaperoned for the exam.  1. Well woman exam Discussed breast self exam Discussed calcium and vit D intake Mammogram in 8/22 Colonoscopy UTD Labs with primary  2. Vaginal atrophy Not sexually active

## 2020-08-07 ENCOUNTER — Ambulatory Visit (INDEPENDENT_AMBULATORY_CARE_PROVIDER_SITE_OTHER): Payer: BC Managed Care – PPO | Admitting: Obstetrics and Gynecology

## 2020-08-07 ENCOUNTER — Other Ambulatory Visit: Payer: Self-pay

## 2020-08-07 ENCOUNTER — Encounter: Payer: Self-pay | Admitting: Obstetrics and Gynecology

## 2020-08-07 VITALS — BP 122/66 | HR 92 | Ht 59.0 in | Wt 126.0 lb

## 2020-08-07 DIAGNOSIS — Z01419 Encounter for gynecological examination (general) (routine) without abnormal findings: Secondary | ICD-10-CM | POA: Diagnosis not present

## 2020-08-07 DIAGNOSIS — N952 Postmenopausal atrophic vaginitis: Secondary | ICD-10-CM

## 2020-08-07 NOTE — Patient Instructions (Signed)

## 2020-08-14 ENCOUNTER — Telehealth: Payer: Self-pay | Admitting: Urology

## 2020-08-14 NOTE — Telephone Encounter (Signed)
DOS - 08/29/20  EPF LEFT --- 17793 REPAIR TENDON LEFT --- 28086   [employer]BS EFFECTIVE DATE 04/06/19   PLAN DEDUCTIBLE - $3,000.00 W/ $1,682.00 REMAINING OUT OF POCKET - $3,000.00 W/ $1,682.00 REMAINING COINSURANCE - 0% COAPY -  $0.00   SPOKE WITH NIESHA WITH [employer] AND SHE STATED THAT FOR CPT CODES 90300 AND [phone] NO PRIOR AUTH IS REQUIRED.   REF # I - [id number]

## 2020-08-25 DIAGNOSIS — Z79899 Other long term (current) drug therapy: Secondary | ICD-10-CM | POA: Diagnosis not present

## 2020-08-25 DIAGNOSIS — R7989 Other specified abnormal findings of blood chemistry: Secondary | ICD-10-CM | POA: Diagnosis not present

## 2020-08-25 DIAGNOSIS — M359 Systemic involvement of connective tissue, unspecified: Secondary | ICD-10-CM | POA: Diagnosis not present

## 2020-08-25 DIAGNOSIS — F419 Anxiety disorder, unspecified: Secondary | ICD-10-CM | POA: Diagnosis not present

## 2020-08-25 DIAGNOSIS — E559 Vitamin D deficiency, unspecified: Secondary | ICD-10-CM | POA: Diagnosis not present

## 2020-08-27 ENCOUNTER — Other Ambulatory Visit: Payer: Self-pay | Admitting: Podiatry

## 2020-08-27 MED ORDER — CEPHALEXIN 500 MG PO CAPS: 500.0000 mg | ORAL_CAPSULE | Freq: Three times a day (TID) | ORAL | 0 refills | Status: DC

## 2020-08-27 MED ORDER — OXYCODONE-ACETAMINOPHEN 10-325 MG PO TABS: 1.0000 | ORAL_TABLET | Freq: Three times a day (TID) | ORAL | 0 refills | Status: AC | PRN

## 2020-08-27 MED ORDER — ONDANSETRON HCL 4 MG PO TABS: 4.0000 mg | ORAL_TABLET | Freq: Three times a day (TID) | ORAL | 0 refills | Status: DC | PRN

## 2020-08-29 ENCOUNTER — Telehealth: Payer: Self-pay | Admitting: Podiatry

## 2020-08-29 DIAGNOSIS — Y929 Unspecified place or not applicable: Secondary | ICD-10-CM | POA: Diagnosis not present

## 2020-08-29 DIAGNOSIS — S86312A Strain of muscle(s) and tendon(s) of peroneal muscle group at lower leg level, left leg, initial encounter: Secondary | ICD-10-CM | POA: Diagnosis not present

## 2020-08-29 DIAGNOSIS — M722 Plantar fascial fibromatosis: Secondary | ICD-10-CM | POA: Diagnosis not present

## 2020-08-29 DIAGNOSIS — X58XXXA Exposure to other specified factors, initial encounter: Secondary | ICD-10-CM | POA: Diagnosis not present

## 2020-08-29 DIAGNOSIS — M25572 Pain in left ankle and joints of left foot: Secondary | ICD-10-CM | POA: Diagnosis not present

## 2020-08-29 DIAGNOSIS — M7672 Peroneal tendinitis, left leg: Secondary | ICD-10-CM | POA: Diagnosis not present

## 2020-08-29 NOTE — Telephone Encounter (Signed)
Pharmacy called on 08/27/2020 at 6:12pm stating that the patient had an allergy with an ingredient in keflex. She has been on Augmentin before and verbal orders given for Augmentin.

## 2020-09-03 ENCOUNTER — Other Ambulatory Visit: Payer: Self-pay

## 2020-09-03 ENCOUNTER — Ambulatory Visit (INDEPENDENT_AMBULATORY_CARE_PROVIDER_SITE_OTHER): Payer: BC Managed Care – PPO | Admitting: Podiatry

## 2020-09-03 ENCOUNTER — Encounter: Payer: Self-pay | Admitting: Podiatry

## 2020-09-03 VITALS — BP 97/51 | HR 84 | Temp 98.8°F

## 2020-09-03 DIAGNOSIS — M7672 Peroneal tendinitis, left leg: Secondary | ICD-10-CM

## 2020-09-03 DIAGNOSIS — Z9889 Other specified postprocedural states: Secondary | ICD-10-CM

## 2020-09-03 NOTE — Progress Notes (Signed)
She presents with her husband today first postop visit status post peroneal tendon repair left ankle.  She denies fever chills nausea vomiting muscle aches pains calf pain back pain chest pain shortness of breath.  States that she lasted about 2 days with the crutches and purchased a knee scooter.  Objective: Vital signs are stable alert and oriented x3 there is no erythema edema cellulitis drainage or odor to the portions of the forefoot that we can see she has good range of motion of the toes and no pain with her cast intact.  Cast is dry and clean.  Assessment: Well-healing surgical foot x1 week.  Plan: Continue the use of pain medication as needed continue to keep it elevated continue range of motion of the toes continue her daily baby aspirin which she had previously discontinued.  Follow-up with her in a week for cast change

## 2020-09-10 ENCOUNTER — Encounter: Payer: Self-pay | Admitting: Podiatry

## 2020-09-10 ENCOUNTER — Ambulatory Visit (INDEPENDENT_AMBULATORY_CARE_PROVIDER_SITE_OTHER): Payer: BC Managed Care – PPO | Admitting: Podiatry

## 2020-09-10 ENCOUNTER — Other Ambulatory Visit: Payer: Self-pay

## 2020-09-10 DIAGNOSIS — Z9889 Other specified postprocedural states: Secondary | ICD-10-CM

## 2020-09-10 DIAGNOSIS — M7672 Peroneal tendinitis, left leg: Secondary | ICD-10-CM | POA: Diagnosis not present

## 2020-09-10 NOTE — Progress Notes (Signed)
She presents today for postop visit date of surgery 08/29/2020 of her peroneal tendon repair and an endoscopic plantar fasciotomy.  Denies not much pain she has not taken a pain pill the last couple of days she says.  Objective: Vital signs are stable she is alert and oriented x3.  Presents today with her cast dry and clean.  Once removed demonstrates sutures are in place staples are in place she has good range of motion dorsiflexion plantarflexion.  Assessment: Well-healing peroneal tendon repair and well-healing endoscopic fasciotomy.  Plan: Remove a couple of the distal staples today because they were loose.  Also remove the 2 sutures medially and laterally from the endoscopic procedure.  Redressed the foot dressed a compressive dressing and applied a mid diaphyseal below-knee cast left foot.  Tolerated procedure well without complications.  Should she have questions or concerns she will notify us immediately she knows to be nonweightbearing and I will follow-up with her in 2 weeks for cast exchange.

## 2020-09-24 ENCOUNTER — Ambulatory Visit (INDEPENDENT_AMBULATORY_CARE_PROVIDER_SITE_OTHER): Payer: BC Managed Care – PPO | Admitting: Podiatry

## 2020-09-24 ENCOUNTER — Other Ambulatory Visit: Payer: Self-pay

## 2020-09-24 ENCOUNTER — Encounter: Payer: Self-pay | Admitting: Podiatry

## 2020-09-24 DIAGNOSIS — M7672 Peroneal tendinitis, left leg: Secondary | ICD-10-CM | POA: Diagnosis not present

## 2020-09-24 DIAGNOSIS — M722 Plantar fascial fibromatosis: Secondary | ICD-10-CM

## 2020-09-24 DIAGNOSIS — Z9889 Other specified postprocedural states: Secondary | ICD-10-CM

## 2020-09-24 NOTE — Progress Notes (Signed)
She presents today for follow-up of her peroneal tendon repair she presents with her cast in a nonweightbearing fashion.  Date of surgery 08/29/2020 so she is almost 4 weeks out of surgery.  Cast was intact and clean.  Objective: Cast is intact including presents with her knee scooter and her husband today.  Nonweightbearing fashion.  Cast was removed today dressed her dressing was removed staples were removed margins remain well coapted no signs of infection she has good dorsiflexion and plantarflexion inversion and eversion.  Assessment: Well-healing surgical ankle.  Plan: Redressed today with compression dressing placed her back in her cam walker and she will be nonweightbearing for another 2 weeks.  In 2 weeks we will start partial weightbearing and progress to full weightbearing over 2 more weeks.  No ambulating without the use of the cam walker.  And she should start at home physical therapy which I discussed with her today and continue this until we discontinue it.

## 2020-10-08 ENCOUNTER — Encounter: Payer: Self-pay | Admitting: Podiatry

## 2020-10-08 ENCOUNTER — Other Ambulatory Visit: Payer: Self-pay

## 2020-10-08 ENCOUNTER — Ambulatory Visit (INDEPENDENT_AMBULATORY_CARE_PROVIDER_SITE_OTHER): Payer: BC Managed Care – PPO | Admitting: Podiatry

## 2020-10-08 DIAGNOSIS — Z9889 Other specified postprocedural states: Secondary | ICD-10-CM

## 2020-10-08 DIAGNOSIS — M7672 Peroneal tendinitis, left leg: Secondary | ICD-10-CM

## 2020-10-08 DIAGNOSIS — M722 Plantar fascial fibromatosis: Secondary | ICD-10-CM

## 2020-10-08 NOTE — Progress Notes (Signed)
She presents today date of surgery 08/29/2020 peroneal tendon repair and an EPF left.  States that I have a little more pain than I did normally.  She states that she did stepped wrong putting a lot of pressure on the foot while he was in her boot.  Objective: Vital signs stable oriented x3.  Pulses are palpable.  There is no erythema to some mild edema no cellulitis drainage or odor foot appears to be healing very nicely.  Assessment: Well-healing surgical foot.  Plan: We will allow her to start walking on this partial weightbearing initially with her scooter and then to full weightbearing over the next 2 to 3 weeks.  I will follow-up with her at that time.  May need to consider physical therapy at that time.

## 2020-10-15 ENCOUNTER — Encounter: Payer: BC Managed Care – PPO | Admitting: Podiatry

## 2020-10-27 ENCOUNTER — Other Ambulatory Visit: Payer: Self-pay | Admitting: Family Medicine

## 2020-10-27 DIAGNOSIS — Z1231 Encounter for screening mammogram for malignant neoplasm of breast: Secondary | ICD-10-CM

## 2020-10-30 DIAGNOSIS — R1084 Generalized abdominal pain: Secondary | ICD-10-CM | POA: Diagnosis not present

## 2020-10-30 DIAGNOSIS — R197 Diarrhea, unspecified: Secondary | ICD-10-CM | POA: Diagnosis not present

## 2020-10-30 DIAGNOSIS — K580 Irritable bowel syndrome with diarrhea: Secondary | ICD-10-CM | POA: Diagnosis not present

## 2020-11-03 ENCOUNTER — Encounter: Payer: BC Managed Care – PPO | Admitting: Podiatry

## 2020-11-06 ENCOUNTER — Ambulatory Visit
Admission: RE | Admit: 2020-11-06 | Discharge: 2020-11-06 | Disposition: A | Discharge status: Home / Self Care | Payer: BC Managed Care – PPO | Source: Ambulatory Visit | Attending: Family Medicine | Admitting: Family Medicine

## 2020-11-06 ENCOUNTER — Other Ambulatory Visit: Payer: Self-pay

## 2020-11-06 DIAGNOSIS — Z1231 Encounter for screening mammogram for malignant neoplasm of breast: Secondary | ICD-10-CM

## 2020-11-11 ENCOUNTER — Other Ambulatory Visit: Payer: Self-pay | Admitting: Family Medicine

## 2020-11-11 DIAGNOSIS — R928 Other abnormal and inconclusive findings on diagnostic imaging of breast: Secondary | ICD-10-CM

## 2020-11-19 ENCOUNTER — Ambulatory Visit (INDEPENDENT_AMBULATORY_CARE_PROVIDER_SITE_OTHER): Payer: BC Managed Care – PPO | Admitting: Podiatry

## 2020-11-19 ENCOUNTER — Other Ambulatory Visit: Payer: Self-pay

## 2020-11-19 DIAGNOSIS — Z9889 Other specified postprocedural states: Secondary | ICD-10-CM | POA: Diagnosis not present

## 2020-11-19 DIAGNOSIS — M722 Plantar fascial fibromatosis: Secondary | ICD-10-CM

## 2020-11-19 DIAGNOSIS — M7672 Peroneal tendinitis, left leg: Secondary | ICD-10-CM

## 2020-11-19 NOTE — Progress Notes (Signed)
She presents today for follow-up of her surgical ankle left she is status post peroneal tendon repair and endoscopic plantar fasciotomy.  She states that she is doing pretty well she is walking with the single cane still utilizing her cam walker.  States that it swells by the end of the day and is kind of tender along the dorsal lateral side and dorsal aspect of the foot.  Objective: Vital signs are stable she is alert and oriented x3 there is mild edema no erythema cellulitis drainage or odor mild tenderness on palpation of the fourth fifth tarsometatarsal joint as well as a tight scar along the lateral aspect of the foot.  Assessment: Peroneal tendon repair with an endoscopic fasciotomy slowly healing.  We are 10 weeks now.  Plan: Discussed etiology pathology conservative surgical therapies at this point I think her best option would be to be placed in a Tri-Lock brace with possibly a Darco shoe or her regular tennis shoe.  I also recommended that she start physical therapy.  She would like to go to [facility] for physical therapy and we will arrange that for her.  I will follow-up with her once physical therapy is complete.

## 2020-11-26 ENCOUNTER — Other Ambulatory Visit: Payer: Self-pay

## 2020-11-26 ENCOUNTER — Ambulatory Visit
Admission: RE | Admit: 2020-11-26 | Discharge: 2020-11-26 | Disposition: A | Discharge status: Home / Self Care | Payer: BC Managed Care – PPO | Source: Ambulatory Visit | Attending: Family Medicine | Admitting: Family Medicine

## 2020-11-26 ENCOUNTER — Ambulatory Visit: Payer: BC Managed Care – PPO

## 2020-11-26 DIAGNOSIS — R928 Other abnormal and inconclusive findings on diagnostic imaging of breast: Secondary | ICD-10-CM | POA: Diagnosis not present

## 2020-11-26 DIAGNOSIS — R922 Inconclusive mammogram: Secondary | ICD-10-CM | POA: Diagnosis not present

## 2020-12-02 DIAGNOSIS — H04123 Dry eye syndrome of bilateral lacrimal glands: Secondary | ICD-10-CM | POA: Diagnosis not present

## 2020-12-02 DIAGNOSIS — Z79899 Other long term (current) drug therapy: Secondary | ICD-10-CM | POA: Diagnosis not present

## 2020-12-02 DIAGNOSIS — M79601 Pain in right arm: Secondary | ICD-10-CM | POA: Insufficient documentation

## 2020-12-02 DIAGNOSIS — Z9889 Other specified postprocedural states: Secondary | ICD-10-CM | POA: Diagnosis not present

## 2020-12-02 DIAGNOSIS — H2513 Age-related nuclear cataract, bilateral: Secondary | ICD-10-CM | POA: Diagnosis not present

## 2020-12-10 DIAGNOSIS — D125 Benign neoplasm of sigmoid colon: Secondary | ICD-10-CM | POA: Diagnosis not present

## 2020-12-10 DIAGNOSIS — D123 Benign neoplasm of transverse colon: Secondary | ICD-10-CM | POA: Diagnosis not present

## 2020-12-10 DIAGNOSIS — K6389 Other specified diseases of intestine: Secondary | ICD-10-CM | POA: Diagnosis not present

## 2020-12-10 DIAGNOSIS — K3189 Other diseases of stomach and duodenum: Secondary | ICD-10-CM | POA: Diagnosis not present

## 2020-12-10 DIAGNOSIS — R197 Diarrhea, unspecified: Secondary | ICD-10-CM | POA: Diagnosis not present

## 2020-12-10 DIAGNOSIS — R1084 Generalized abdominal pain: Secondary | ICD-10-CM | POA: Diagnosis not present

## 2020-12-10 DIAGNOSIS — K2980 Duodenitis without bleeding: Secondary | ICD-10-CM | POA: Diagnosis not present

## 2020-12-10 DIAGNOSIS — K449 Diaphragmatic hernia without obstruction or gangrene: Secondary | ICD-10-CM | POA: Diagnosis not present

## 2020-12-10 DIAGNOSIS — D175 Benign lipomatous neoplasm of intra-abdominal organs: Secondary | ICD-10-CM | POA: Diagnosis not present

## 2020-12-15 DIAGNOSIS — R1084 Generalized abdominal pain: Secondary | ICD-10-CM | POA: Diagnosis not present

## 2020-12-15 DIAGNOSIS — R197 Diarrhea, unspecified: Secondary | ICD-10-CM | POA: Diagnosis not present

## 2020-12-15 DIAGNOSIS — N200 Calculus of kidney: Secondary | ICD-10-CM | POA: Diagnosis not present

## 2020-12-15 DIAGNOSIS — K769 Liver disease, unspecified: Secondary | ICD-10-CM | POA: Diagnosis not present

## 2020-12-31 ENCOUNTER — Encounter: Payer: Self-pay | Admitting: Podiatry

## 2021-01-26 ENCOUNTER — Ambulatory Visit (INDEPENDENT_AMBULATORY_CARE_PROVIDER_SITE_OTHER): Payer: BC Managed Care – PPO

## 2021-01-26 ENCOUNTER — Encounter: Payer: Self-pay | Admitting: Podiatry

## 2021-01-26 ENCOUNTER — Other Ambulatory Visit: Payer: Self-pay

## 2021-01-26 ENCOUNTER — Ambulatory Visit (INDEPENDENT_AMBULATORY_CARE_PROVIDER_SITE_OTHER): Payer: BC Managed Care – PPO | Admitting: Podiatry

## 2021-01-26 DIAGNOSIS — M722 Plantar fascial fibromatosis: Secondary | ICD-10-CM

## 2021-01-26 DIAGNOSIS — S9032XA Contusion of left foot, initial encounter: Secondary | ICD-10-CM

## 2021-01-26 DIAGNOSIS — M7672 Peroneal tendinitis, left leg: Secondary | ICD-10-CM

## 2021-01-26 DIAGNOSIS — G5782 Other specified mononeuropathies of left lower limb: Secondary | ICD-10-CM

## 2021-01-26 DIAGNOSIS — Z9889 Other specified postprocedural states: Secondary | ICD-10-CM

## 2021-01-26 DIAGNOSIS — G5762 Lesion of plantar nerve, left lower limb: Secondary | ICD-10-CM

## 2021-01-26 MED ORDER — TRIAMCINOLONE ACETONIDE 40 MG/ML IJ SUSP
40.0000 mg | Freq: Once | INTRAMUSCULAR | Status: AC
  Administered 2021-01-26: 40 mg

## 2021-01-26 NOTE — Progress Notes (Signed)
She presents today for postop visit 08/29/2020 peroneal tendon repair has been in in physical therapy for quite some time now and is started to experience pain across the forefoot states the tendon itself is doing fairly well but the forefoot is making it impossible for her to walk and be on her feet all day.  States that she is having pain even when she is going to bed at night.  Objective: Vital signs are stable alert oriented x3 left foot does demonstrate some edema to the forefoot.  Radiographs taken today do not demonstrate any type of osseous abnormalities other than some mild osteopenia.  I do not see any acute findings with the bone.  She does have tenderness on rotation and range of motion of the second and third metatarsophalangeal joints and medial lateral compression of the forefoot.  She also has palpable Mulder's click to the second and third interdigital space of the left foot.  Assessment: Probable neuroma capsulitis cannot rule out bony contusion or bony edema.  Plan: At this point we injected 10 mg of Kenalog 5 mg of Marcaine to the second and third interdigital space this measurement was divided amongst the 2 interspaces.  And I will follow-up with her in 6 weeks.  We did discuss the use of a Darco shoe.

## 2021-02-05 DIAGNOSIS — R197 Diarrhea, unspecified: Secondary | ICD-10-CM | POA: Diagnosis not present

## 2021-03-13 DIAGNOSIS — R197 Diarrhea, unspecified: Secondary | ICD-10-CM | POA: Diagnosis not present

## 2021-03-18 ENCOUNTER — Ambulatory Visit (INDEPENDENT_AMBULATORY_CARE_PROVIDER_SITE_OTHER): Payer: BC Managed Care – PPO | Admitting: Podiatry

## 2021-03-18 ENCOUNTER — Encounter: Payer: Self-pay | Admitting: Podiatry

## 2021-03-18 ENCOUNTER — Other Ambulatory Visit: Payer: Self-pay

## 2021-03-18 DIAGNOSIS — G5762 Lesion of plantar nerve, left lower limb: Secondary | ICD-10-CM | POA: Diagnosis not present

## 2021-03-18 DIAGNOSIS — G5782 Other specified mononeuropathies of left lower limb: Secondary | ICD-10-CM | POA: Diagnosis not present

## 2021-03-18 MED ORDER — DEXAMETHASONE SODIUM PHOSPHATE 120 MG/30ML IJ SOLN
2.0000 mg | Freq: Once | INTRAMUSCULAR | Status: AC
  Administered 2021-03-18: 10:00:00 2 mg via INTRA_ARTICULAR

## 2021-03-18 NOTE — Progress Notes (Signed)
She presents today primarily for follow-up of her neuroma between the second third interdigital space of the left foot.  Same she states that she is doing about 50% better.  Objective: Still has pain on palpation third interspace left foot.  Assessment: Pain in limb secondary to neuroma third interspace.  Plan: Injected dexamethasone 2 mg to the third interdigital space with local anesthetic tolerated procedure well we will follow-up with her on an as-needed basis.

## 2021-03-23 ENCOUNTER — Encounter: Payer: Self-pay | Admitting: Podiatry

## 2021-04-14 ENCOUNTER — Ambulatory Visit: Payer: BC Managed Care – PPO | Admitting: Podiatry

## 2021-04-16 ENCOUNTER — Encounter: Payer: Self-pay | Admitting: Podiatry

## 2021-04-16 ENCOUNTER — Other Ambulatory Visit: Payer: Self-pay

## 2021-04-16 ENCOUNTER — Ambulatory Visit (INDEPENDENT_AMBULATORY_CARE_PROVIDER_SITE_OTHER): Payer: BC Managed Care – PPO | Admitting: Podiatry

## 2021-04-16 DIAGNOSIS — G5782 Other specified mononeuropathies of left lower limb: Secondary | ICD-10-CM

## 2021-04-16 NOTE — Progress Notes (Signed)
She presents today with her husband for follow-up of her neuroma third interdigital space of her left foot she states that it feels like a rubber band is holding it around the toes he states that it really hurts it really hurts to step on it feels like he stepped on a rock at least the pressure on the inside and have to be careful, she is aware of how I stepped.  Objective: Vital signs are stable she is alert and oriented x3 I have reviewed her past medical history medications allergies surgeries and social history.  Pulses are palpable left foot.  Still has pain on palpation to the third interdigital space of her left foot.  Palpable Mulder's click is noted.  Exquisite pain is noted.  Assessment: Pain in limb secondary to neuroma third interdigital space left foot failed injection therapy.  Plan: Discussed etiology pathology and surgical therapies provided her with options such as alcohol therapy she declined.  Discussed surgical intervention with her consisting of a excision of the neuroma third interdigital space she understands this is amenable to it we did discuss possible postop complications which may include but not limited to postop pain bleeding swell infection recurrence need for further surgery overcorrection under correction also digit loss of limb loss of life and obvious leave the loss of sensation would be normal with that excision of that nerve.  She understands this is amendable to when she understands that she is will be in a cam boot for least 2 weeks and then transferring to either tennis shoe or a Darco shoe.  I will follow-up with her in the near future.

## 2021-04-17 DIAGNOSIS — Z91018 Allergy to other foods: Secondary | ICD-10-CM | POA: Diagnosis not present

## 2021-04-17 DIAGNOSIS — K8681 Exocrine pancreatic insufficiency: Secondary | ICD-10-CM | POA: Diagnosis not present

## 2021-04-17 DIAGNOSIS — R197 Diarrhea, unspecified: Secondary | ICD-10-CM | POA: Diagnosis not present

## 2021-05-01 DIAGNOSIS — R197 Diarrhea, unspecified: Secondary | ICD-10-CM | POA: Diagnosis not present

## 2021-05-12 ENCOUNTER — Telehealth: Payer: Self-pay | Admitting: Urology

## 2021-05-12 NOTE — Telephone Encounter (Signed)
DOS - 06/05/21  NEURECTOMY LEFT --- 42683  [employer] EFFECTIVE DATE - 12/04/17   PLAN DEDUCTIBLE - $3,000.00 W/ $2,589.00 REMAINING OUT OF POCKET - $3,000.00 W/ $2,589.00 REMAINING COINSURANCE - 0% COAPY -  $0.00     SPOKE WITH PAMELA G. WITH [employer] AND SHE STATED THAT FOR CPT CODES 41962 NO PRIOR AUTH IS REQUIRED.    REF # I - [id number]

## 2021-06-03 ENCOUNTER — Other Ambulatory Visit: Payer: Self-pay | Admitting: Podiatry

## 2021-06-03 HISTORY — PX: FOOT SURGERY: SHX648

## 2021-06-03 MED ORDER — OXYCODONE-ACETAMINOPHEN 10-325 MG PO TABS: 1.0000 | ORAL_TABLET | Freq: Three times a day (TID) | ORAL | 0 refills | Status: AC | PRN

## 2021-06-03 MED ORDER — ONDANSETRON HCL 4 MG PO TABS: 4.0000 mg | ORAL_TABLET | Freq: Three times a day (TID) | ORAL | 0 refills | Status: DC | PRN

## 2021-06-03 MED ORDER — CEPHALEXIN 500 MG PO CAPS: 500.0000 mg | ORAL_CAPSULE | Freq: Three times a day (TID) | ORAL | 0 refills | Status: DC

## 2021-06-05 DIAGNOSIS — G5762 Lesion of plantar nerve, left lower limb: Secondary | ICD-10-CM | POA: Diagnosis not present

## 2021-06-05 DIAGNOSIS — G5782 Other specified mononeuropathies of left lower limb: Secondary | ICD-10-CM | POA: Diagnosis not present

## 2021-06-05 DIAGNOSIS — M25572 Pain in left ankle and joints of left foot: Secondary | ICD-10-CM | POA: Diagnosis not present

## 2021-06-11 ENCOUNTER — Encounter: Payer: Self-pay | Admitting: Podiatry

## 2021-06-11 ENCOUNTER — Ambulatory Visit (INDEPENDENT_AMBULATORY_CARE_PROVIDER_SITE_OTHER): Payer: BC Managed Care – PPO | Admitting: Podiatry

## 2021-06-11 ENCOUNTER — Other Ambulatory Visit: Payer: Self-pay

## 2021-06-11 DIAGNOSIS — G5782 Other specified mononeuropathies of left lower limb: Secondary | ICD-10-CM

## 2021-06-11 DIAGNOSIS — Z9889 Other specified postprocedural states: Secondary | ICD-10-CM

## 2021-06-11 NOTE — Progress Notes (Signed)
She presents today for her first postop visit date of surgery was 06/05/2021 excision neuroma third interdigital space of her left foot.  She states that is doing okay insulin but saw her specially when she tries to move her toes. ? ?Objective: Vital signs are stable she is alert and oriented x3.  Pulses are intact bilateral there is no erythema edema cellulitis drainage or odor Prolene sutures are intact margins appear to be coapting very nicely. ? ?Assessment well-healing surgical foot. ? ?Plan: Redressed today follow-up with her in 1 week with hopes to remove sutures. ? ? ?

## 2021-06-18 ENCOUNTER — Encounter: Payer: Self-pay | Admitting: Podiatry

## 2021-06-18 ENCOUNTER — Ambulatory Visit (INDEPENDENT_AMBULATORY_CARE_PROVIDER_SITE_OTHER): Payer: BC Managed Care – PPO | Admitting: Podiatry

## 2021-06-18 ENCOUNTER — Other Ambulatory Visit: Payer: Self-pay

## 2021-06-18 DIAGNOSIS — G5782 Other specified mononeuropathies of left lower limb: Secondary | ICD-10-CM | POA: Diagnosis not present

## 2021-06-18 NOTE — Progress Notes (Signed)
She presents today for postop visit date of surgery is 06/05/2021 excision neuroma third interdigital space of the left foot.  She states that she is doing well.  She denies fever chills nausea vomiting muscle aches or pains.  States that she is not looking forward to getting the sutures removed. ? ?Objective: Vital signs are stable she is alert and oriented x3 presents today in her cam walker and dry sterile compressive dressing that we applied last visit that was removed today demonstrating no erythema edema cellulitis drainage or odor sutures intact and margins are well coapted. ? ?Assessment: Well-healing surgical foot. ? ?Plan: Remove sutures today she tolerated that very well applied some Betadine and then a compression anklet.  I will follow-up with her in 2 weeks asked her to start wearing her Darco shoe. ?

## 2021-07-02 ENCOUNTER — Encounter: Payer: BC Managed Care – PPO | Admitting: Podiatry

## 2021-07-07 ENCOUNTER — Ambulatory Visit (INDEPENDENT_AMBULATORY_CARE_PROVIDER_SITE_OTHER): Payer: BC Managed Care – PPO | Admitting: Podiatry

## 2021-07-07 DIAGNOSIS — Z9889 Other specified postprocedural states: Secondary | ICD-10-CM

## 2021-07-07 DIAGNOSIS — G5782 Other specified mononeuropathies of left lower limb: Secondary | ICD-10-CM

## 2021-07-07 NOTE — Progress Notes (Signed)
She presents today date of surgery 06/05/2021 states that other than my toes being numb everything feels great I have no problems whatsoever with his left foot. ? ?Objective: Vital signs are stable tellurian x3 considerable scar tissue to the third interdigital space.  But no pain. ? ?Assessment: Pain in limb secondary to scar tissue otherwise well-healing neurectomy. ? ?Plan: Encouraged her to commit some time daily for massage therapy to the incision site.  I would like to follow-up with her in 1 month if she needs anything she is to call us. ?

## 2021-07-16 ENCOUNTER — Encounter: Payer: BC Managed Care – PPO | Admitting: Sports Medicine

## 2021-08-13 DIAGNOSIS — M810 Age-related osteoporosis without current pathological fracture: Secondary | ICD-10-CM | POA: Diagnosis not present

## 2021-08-25 ENCOUNTER — Ambulatory Visit (INDEPENDENT_AMBULATORY_CARE_PROVIDER_SITE_OTHER): Payer: BC Managed Care – PPO | Admitting: Podiatry

## 2021-08-25 ENCOUNTER — Encounter: Payer: Self-pay | Admitting: Podiatry

## 2021-08-25 DIAGNOSIS — M7752 Other enthesopathy of left foot: Secondary | ICD-10-CM | POA: Diagnosis not present

## 2021-08-25 DIAGNOSIS — Z9889 Other specified postprocedural states: Secondary | ICD-10-CM

## 2021-08-25 DIAGNOSIS — G5782 Other specified mononeuropathies of left lower limb: Secondary | ICD-10-CM | POA: Diagnosis not present

## 2021-08-25 MED ORDER — TRIAMCINOLONE ACETONIDE 40 MG/ML IJ SUSP
20.0000 mg | Freq: Once | INTRAMUSCULAR | Status: AC
  Administered 2021-08-25: 20 mg

## 2021-08-25 NOTE — Progress Notes (Signed)
She presents today for follow-up of her neuroma states the neuroma part seems to be doing okay but having pain around the ankle and it may just be from the way that on walking.  She states that overlying the incision is still but the majority of her discomfort is around the ankle as she points to the sinus tarsi area.  Objective: Vital signs are stable alert and oriented x3.  Pulses are palpable.  She has pain on palpation and end range of motion of the subtalar joint of the left foot.  Assessment: Well-healing surgical site.  Subtalar joint capsulitis left.  Plan: Injected sinus tarsi subtalar joint today with Kenalog and local anesthetic follow-up with her on an as-needed basis

## 2021-09-24 DIAGNOSIS — R197 Diarrhea, unspecified: Secondary | ICD-10-CM | POA: Insufficient documentation

## 2021-11-05 DIAGNOSIS — M810 Age-related osteoporosis without current pathological fracture: Secondary | ICD-10-CM | POA: Insufficient documentation

## 2021-12-03 DIAGNOSIS — H04123 Dry eye syndrome of bilateral lacrimal glands: Secondary | ICD-10-CM | POA: Diagnosis not present

## 2021-12-03 DIAGNOSIS — H2513 Age-related nuclear cataract, bilateral: Secondary | ICD-10-CM | POA: Diagnosis not present

## 2021-12-03 DIAGNOSIS — Z79899 Other long term (current) drug therapy: Secondary | ICD-10-CM | POA: Diagnosis not present

## 2021-12-30 ENCOUNTER — Telehealth: Payer: Self-pay | Admitting: *Deleted

## 2021-12-30 NOTE — Patient Outreach (Signed)
  Care Coordination   12/30/2021 Name: Bonnie Mckee MRN: [id number] DOB: 06-10-1960   Care Coordination Outreach Attempts:  An unsuccessful telephone outreach was attempted today to offer the patient information about available care coordination services as a benefit of their health plan.   Follow Up Plan:  Additional outreach attempts will be made to offer the patient care coordination information and services.   Encounter Outcome:  No Answer  Care Coordination Interventions Activated:  No   Care Coordination Interventions:  No, not indicated    Jacqlyn Larsen [facility], [place] RN Care Coordinator [phone]

## 2021-12-31 ENCOUNTER — Telehealth: Payer: Self-pay | Admitting: *Deleted

## 2021-12-31 NOTE — Patient Outreach (Signed)
  Care Coordination   12/31/2021 Name: Bonnie Mckee MRN: [id number] DOB: March 10, 1961   Care Coordination Outreach Attempts:  A second unsuccessful outreach was attempted today to offer the patient with information about available care coordination services as a benefit of their health plan.     Follow Up Plan:  Additional outreach attempts will be made to offer the patient care coordination information and services.   Encounter Outcome:  No Answer  Care Coordination Interventions Activated:  No   Care Coordination Interventions:  No, not indicated    Jacqlyn Larsen [facility], [place] RN Care Coordinator [phone]

## 2022-01-01 ENCOUNTER — Other Ambulatory Visit: Payer: Self-pay | Admitting: Family Medicine

## 2022-01-01 DIAGNOSIS — Z1231 Encounter for screening mammogram for malignant neoplasm of breast: Secondary | ICD-10-CM

## 2022-01-04 ENCOUNTER — Telehealth: Payer: Self-pay | Admitting: *Deleted

## 2022-01-04 NOTE — Patient Outreach (Signed)
  Care Coordination   01/04/2022 Name: Bonnie Mckee MRN: [id number] DOB: 05-15-1960   Care Coordination Outreach Attempts:  A third unsuccessful outreach was attempted today to offer the patient with information about available care coordination services as a benefit of their health plan.   Follow Up Plan:  No further outreach attempts will be made at this time. We have been unable to contact the patient to offer or enroll patient in care coordination services  Encounter Outcome:  No Answer  Care Coordination Interventions Activated:  No   Care Coordination Interventions:  No, not indicated    Jacqlyn Larsen [facility], BSN [facility] RN Care Coordinator [phone]

## 2022-02-01 ENCOUNTER — Ambulatory Visit
Admission: RE | Admit: 2022-02-01 | Discharge: 2022-02-01 | Disposition: A | Discharge status: Home / Self Care | Payer: BC Managed Care – PPO | Source: Ambulatory Visit | Attending: Family Medicine | Admitting: Family Medicine

## 2022-02-01 DIAGNOSIS — Z1231 Encounter for screening mammogram for malignant neoplasm of breast: Secondary | ICD-10-CM

## 2022-02-03 NOTE — Progress Notes (Signed)
61 y.o. [id number] Married White or Caucasian Not Hispanic or Latino female here for annual exam.  No vaginal bleeding. Not sexually active, too uncomfortable, didn't want vaginal estrogen. Intimate without penetration.  She continues to have diarrhea, has been extensive evaluation without a great answer. She will see an Advertising account planner specialist to see if they can help her figure it out.  The stool is watery to soft, she has to wear a depends secondary to incontinence. Can leak a couple of times a week or can go a couple of weeks without it. No help with metamucil or imodium.  The diarrhea started in 1/22.      No LMP recorded. Patient is postmenopausal.          Sexually active: No.  The current method of family planning is post menopausal status.    Exercising: Yes.    The patient has a physically strenuous job, but has no regular exercise apart from work.    Smoker:  vaping   Health Maintenance: Pap: 03/05/2019 WNL Hr HPV Neg;  2018 WNL History of abnormal Pap:  Yesf/u testing okay. Not sure about hpv  MMG:  02/01/22 Density B Bi-rads 1 neg  BMD:   n/a Colonoscopy: last year, some polyps. Needs a f/u colonoscopy in 3 years.  TDaP:  Up to date per patient  Gardasil: NA   reports that she has quit smoking. Her smoking use included cigarettes. She has never used smokeless tobacco. She reports that she does not drink alcohol and does not use drugs. She is an Radiographer, therapeutic. Previously taught in HS. 3 grown children, 1 in [place], 2 in [place]. No grandchildren.    Past Medical History:  Diagnosis Date   Alpha galactosidase deficiency    Paresthesias     Past Surgical History:  Procedure Laterality Date   Bilateral ulnar nerve decompression     CARPAL TUNNEL RELEASE     CESAREAN SECTION     DILATION AND CURETTAGE OF UTERUS     FOOT SURGERY  06/03/2021   HAND SURGERY     right hand   LASIK     rectal sphincter repair      Current Outpatient Medications   Medication Sig Dispense Refill   amitriptyline (ELAVIL) 25 MG tablet 25 mg.     cyclobenzaprine (FLEXERIL) 10 MG tablet cyclobenzaprine 10 mg tablet  TAKE ONE TABLET BY MOUTH TWICE DAILY AS NEEDED     EPINEPHrine 0.3 mg/0.3 mL IJ SOAJ injection Inject into the muscle as directed.     FLUoxetine (PROZAC) 10 MG tablet Take 10 mg by mouth daily.     hydroxychloroquine (PLAQUENIL) 200 MG tablet Take by mouth daily. Take 1.5 tablets daily.     hydrOXYzine (ATARAX/VISTARIL) 25 MG tablet Take 25 mg by mouth daily as needed.     ibuprofen (ADVIL) 800 MG tablet Take 800 mg by mouth 3 (three) times daily as needed.     PROAIR HFA 108 (90 Base) MCG/ACT inhaler      Probiotic Product (PROBIOTIC PO) Take by mouth.     rOPINIRole (REQUIP) 0.25 MG tablet Take 0.25 mg by mouth 3 (three) times daily.     No current facility-administered medications for this visit.    Family History  Problem Relation Age of Onset   Graves' disease Mother    Non-Hodgkin's lymphoma Father    Diabetes Father    Diabetes Paternal Uncle     Review of Systems  Genitourinary:  Positive  for vaginal pain.    Exam:   BP 116/60   Pulse 85   Ht 4\' 11"  (1.499 m)   Wt 128 lb 9.6 oz (58.3 kg)   SpO2 99%   BMI 25.97 kg/m   Weight change: @WEIGHTCHANGE @ Height:   Height: 4\' 11"  (149.9 cm)  Ht Readings from Last 3 Encounters:  02/10/22 4\' 11"  (1.499 m)  08/07/20 4\' 11"  (1.499 m)  05/11/19 4\' 11"  (1.499 m)    General appearance: alert, cooperative and appears stated age Head: Normocephalic, without obvious abnormality, atraumatic Neck: no adenopathy, supple, symmetrical, trachea midline and thyroid normal to inspection and palpation Lungs: clear to auscultation bilaterally Cardiovascular: regular rate and rhythm Breasts: normal appearance, no masses or tenderness Abdomen: soft, non-tender; non distended,  no masses,  no organomegaly Extremities: extremities normal, atraumatic, no cyanosis or edema Skin: Skin color,  texture, turgor normal. No rashes or lesions Lymph nodes: Cervical, supraclavicular, and axillary nodes normal. No abnormal inguinal nodes palpated Neurologic: Grossly normal   Pelvic: External genitalia:  no lesions              Urethra:  normal appearing urethra with no masses, tenderness or lesions              Bartholins and Skenes: normal                 Vagina: very atrophic appearing vagina with normal color and discharge, no lesions              Cervix:  not well seen, very atrophic vagina and uncomfortable with opening of the speculum. Feels normal on BM exam               Bimanual Exam:  Uterus:  normal size, contour, position, consistency, mobility, non-tender              Adnexa: no mass, fullness, tenderness               Rectovaginal: Confirms               Anus:  normal sphincter tone, no lesions  Gae Dry, CMA chaperoned for the exam.   1. Well woman exam Discussed breast self exam Discussed calcium and vit D intake Mammogram and colonoscopy UTD No pap this year Labs with primary

## 2022-02-10 ENCOUNTER — Ambulatory Visit (INDEPENDENT_AMBULATORY_CARE_PROVIDER_SITE_OTHER): Payer: BC Managed Care – PPO | Admitting: Obstetrics and Gynecology

## 2022-02-10 ENCOUNTER — Encounter: Payer: Self-pay | Admitting: Obstetrics and Gynecology

## 2022-02-10 VITALS — BP 116/60 | HR 85 | Ht 59.0 in | Wt 128.6 lb

## 2022-02-10 DIAGNOSIS — Z01419 Encounter for gynecological examination (general) (routine) without abnormal findings: Secondary | ICD-10-CM

## 2022-02-10 NOTE — Patient Instructions (Signed)

## 2022-03-16 DIAGNOSIS — M359 Systemic involvement of connective tissue, unspecified: Secondary | ICD-10-CM | POA: Diagnosis not present

## 2022-03-16 DIAGNOSIS — Z79899 Other long term (current) drug therapy: Secondary | ICD-10-CM | POA: Diagnosis not present

## 2022-05-26 DIAGNOSIS — T781XXA Other adverse food reactions, not elsewhere classified, initial encounter: Secondary | ICD-10-CM | POA: Diagnosis not present

## 2022-05-26 DIAGNOSIS — K5229 Other allergic and dietetic gastroenteritis and colitis: Secondary | ICD-10-CM | POA: Diagnosis not present

## 2022-05-26 DIAGNOSIS — Z91018 Allergy to other foods: Secondary | ICD-10-CM | POA: Diagnosis not present

## 2022-05-26 DIAGNOSIS — Z91014 Allergy to mammalian meats: Secondary | ICD-10-CM | POA: Diagnosis not present

## 2022-12-16 DIAGNOSIS — Z79899 Other long term (current) drug therapy: Secondary | ICD-10-CM | POA: Diagnosis not present

## 2022-12-16 DIAGNOSIS — H04123 Dry eye syndrome of bilateral lacrimal glands: Secondary | ICD-10-CM | POA: Diagnosis not present

## 2022-12-16 DIAGNOSIS — H2511 Age-related nuclear cataract, right eye: Secondary | ICD-10-CM | POA: Diagnosis not present

## 2023-01-28 DIAGNOSIS — H25811 Combined forms of age-related cataract, right eye: Secondary | ICD-10-CM | POA: Diagnosis not present

## 2023-02-11 DIAGNOSIS — H25812 Combined forms of age-related cataract, left eye: Secondary | ICD-10-CM | POA: Diagnosis not present

## 2023-03-01 ENCOUNTER — Other Ambulatory Visit: Payer: Self-pay | Admitting: Family Medicine

## 2023-03-01 DIAGNOSIS — Z1231 Encounter for screening mammogram for malignant neoplasm of breast: Secondary | ICD-10-CM

## 2023-04-01 ENCOUNTER — Ambulatory Visit
Admission: RE | Admit: 2023-04-01 | Discharge: 2023-04-01 | Disposition: A | Discharge status: Home / Self Care | Payer: BC Managed Care – PPO | Source: Ambulatory Visit | Attending: Family Medicine | Admitting: Family Medicine

## 2023-04-01 DIAGNOSIS — Z1231 Encounter for screening mammogram for malignant neoplasm of breast: Secondary | ICD-10-CM

## 2023-06-22 NOTE — Progress Notes (Signed)
 63 y.o. [id number] Married Caucasian female here for annual exam.  Patient complains of burning sensation when urinating, urinary frequency, no back pain or pelvic pain. No discharge or odor. States that she went to rheumatology appointment about one week ago that collected urine sample. Results from urine had high leukocytes per patient. They did not treat this. Symptoms started about 2/3 weeks ago.  Has vaginal atrophy.  Declines vaginal estrogens.   Hx alpha gal dx in 2018.   Has chronic diarrhea controlled with Gastrocrom.    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  Married. 3 children.   PCP: Joaquin Courts, DO   No LMP recorded. Patient is postmenopausal.           Sexually active: Yes.    The current method of family planning is post menopausal status.    Menopausal hormone therapy:  n/a Exercising: Yes.     Walking Smoker:  former.  She vapes.   OB History  Gravida Para Term Preterm AB Living  4 3 3  1 3   SAB IAB Ectopic Multiple Live Births  1    3    # Outcome Date GA Lbr Len/2nd Weight Sex Type Anes PTL Lv  4 SAB           3 Term      CS-Unspec   LIV  2 Term      Vag-Spont   LIV  1 Term      Vag-Spont   LIV     HEALTH MAINTENANCE: Last 2 paps:  03/05/19 neg: HR HPV neg History of abnormal Pap or positive HPV:  yes Mammogram:   04/01/23 Breast density Cat B, BI-RADS CAT 1 neg Colonoscopy:  2022 Bone Density:  osteoporosis per patient.  On Prolia through PCP.   Immunization History  Administered Date(s) Administered   [employer] Vaccination 06/07/2019, 07/09/2019, 03/05/2020   Tdap 11/01/2012      reports that she has been smoking e-cigarettes. She has never used smokeless tobacco. She reports that she does not drink alcohol and does not use drugs.  Past Medical History:  Diagnosis Date   Alpha galactosidase deficiency    Gastrointestinal allergy syndrome    Paresthesias     Past Surgical History:  Procedure Laterality Date    Bilateral ulnar nerve decompression     CARPAL TUNNEL RELEASE     cataract     cataract surgery removal from both eyes   CESAREAN SECTION     DILATION AND CURETTAGE OF UTERUS     FOOT SURGERY  06/03/2021   HAND SURGERY     right hand   LASIK     rectal sphincter repair      Current Outpatient Medications  Medication Sig Dispense Refill   amitriptyline (ELAVIL) 25 MG tablet 25 mg.     cromolyn (GASTROCROM) 100 MG/5ML solution Take 10 mL 4 times a day by oral route for 100 days.     cyclobenzaprine (FLEXERIL) 10 MG tablet cyclobenzaprine 10 mg tablet  TAKE ONE TABLET BY MOUTH TWICE DAILY AS NEEDED     EPINEPHrine 0.3 mg/0.3 mL IJ SOAJ injection Inject into the muscle as directed.     FLUoxetine (PROZAC) 10 MG tablet Take 10 mg by mouth daily.     hydroxychloroquine (PLAQUENIL) 200 MG tablet Take by mouth daily. Take 1.5 tablets daily.     hydrOXYzine (ATARAX/VISTARIL) 25 MG tablet Take 25 mg by mouth daily as needed.  ibuprofen (ADVIL) 800 MG tablet Take 800 mg by mouth 3 (three) times daily as needed.     PROAIR HFA 108 (90 Base) MCG/ACT inhaler      rOPINIRole (REQUIP) 0.25 MG tablet Take 0.25 mg by mouth 3 (three) times daily.     No current facility-administered medications for this visit.    ALLERGIES: Hydrocodone-acetaminophen, Lactose, Sesame oil, Shellfish allergy, Alpha-gal, and Codeine  Family History  Problem Relation Age of Onset   Graves' disease Mother    Non-Hodgkin's lymphoma Father    Diabetes Father    Diabetes Paternal Uncle     Review of Systems  Genitourinary:  Positive for dysuria.    PHYSICAL EXAM:  BP 116/76 (BP Location: Left Arm, Patient Position: Sitting)   Pulse 60   Ht 4\' 11"  (1.499 m)   Wt 131 lb (59.4 kg)   SpO2 100%   BMI 26.46 kg/m     General appearance: alert, cooperative and appears stated age Head: normocephalic, without obvious abnormality, atraumatic Neck: no adenopathy, supple, symmetrical, trachea midline and thyroid  normal to inspection and palpation Lungs: clear to auscultation bilaterally Breasts: normal appearance, no masses or tenderness, No nipple retraction or dimpling, No nipple discharge or bleeding, No axillary adenopathy Heart: regular rate and rhythm Abdomen: soft, non-tender; no masses, no organomegaly Extremities: extremities normal, atraumatic, no cyanosis or edema Skin: skin color, texture, turgor normal. No rashes or lesions Lymph nodes: cervical, supraclavicular, and axillary nodes normal. Neurologic: grossly normal  Pelvic: External genitalia:  no lesions              No abnormal inguinal nodes palpated.              Urethra:  normal appearing urethra with no masses, tenderness or lesions              Bartholins and Skenes: normal                 Vagina: atrophy noted.               Cervix: no lesions              Pap taken: Yes.   Bimanual Exam:  Uterus:  normal size, contour, position, consistency, mobility, non-tender              Adnexa: no mass, fullness, tenderness              Rectal exam: Yes.  .  Confirms.              Anus:  normal sphincter tone, no lesions  Chaperone was present for exam:  Gwenith Spitz, CMA  ASSESSMENT: Well woman visit with gynecologic exam. Cervical cancer screening.  Vaginal atrophy.  Dysuria.  Osteoporosis.  On Prolia through her PCP.  PHQ-9: 0  PLAN: Mammogram screening discussed. Self breast awareness reviewed. Pap and HRV collected:  Yes.   Guidelines for Calcium, Vitamin D, regular exercise program including cardiovascular and weight bearing exercise. Urinalysis:  sg 1.020, ph 5.0, 10 - 20 WBC, NS RBC, 0 - 5 squams, many bacteria, moderate mucus.  Reflex cx sent. Medication:  Bactrim DS po bid x 3 days.   We discussed cooking oils and vaginal vit E for treating atrophy symptoms. Follow up:  yearly and prn.

## 2023-06-27 ENCOUNTER — Ambulatory Visit: Payer: BC Managed Care – PPO | Admitting: Obstetrics and Gynecology

## 2023-06-28 DIAGNOSIS — R519 Headache, unspecified: Secondary | ICD-10-CM | POA: Diagnosis not present

## 2023-06-28 DIAGNOSIS — M359 Systemic involvement of connective tissue, unspecified: Secondary | ICD-10-CM | POA: Diagnosis not present

## 2023-06-28 DIAGNOSIS — M1990 Unspecified osteoarthritis, unspecified site: Secondary | ICD-10-CM | POA: Diagnosis not present

## 2023-06-28 DIAGNOSIS — R5383 Other fatigue: Secondary | ICD-10-CM | POA: Diagnosis not present

## 2023-06-28 DIAGNOSIS — Z79899 Other long term (current) drug therapy: Secondary | ICD-10-CM | POA: Diagnosis not present

## 2023-06-28 DIAGNOSIS — R6889 Other general symptoms and signs: Secondary | ICD-10-CM | POA: Diagnosis not present

## 2023-07-06 ENCOUNTER — Other Ambulatory Visit (HOSPITAL_COMMUNITY)
Admission: RE | Admit: 2023-07-06 | Discharge: 2023-07-06 | Disposition: A | Discharge status: Home / Self Care | Source: Ambulatory Visit | Attending: Obstetrics and Gynecology | Admitting: Obstetrics and Gynecology

## 2023-07-06 ENCOUNTER — Encounter: Payer: Self-pay | Admitting: Obstetrics and Gynecology

## 2023-07-06 ENCOUNTER — Ambulatory Visit (INDEPENDENT_AMBULATORY_CARE_PROVIDER_SITE_OTHER): Admitting: Obstetrics and Gynecology

## 2023-07-06 VITALS — BP 116/76 | HR 60 | Ht 59.0 in | Wt 131.0 lb

## 2023-07-06 DIAGNOSIS — R300 Dysuria: Secondary | ICD-10-CM

## 2023-07-06 DIAGNOSIS — Z124 Encounter for screening for malignant neoplasm of cervix: Secondary | ICD-10-CM | POA: Insufficient documentation

## 2023-07-06 DIAGNOSIS — Z1331 Encounter for screening for depression: Secondary | ICD-10-CM | POA: Diagnosis not present

## 2023-07-06 DIAGNOSIS — Z01419 Encounter for gynecological examination (general) (routine) without abnormal findings: Secondary | ICD-10-CM | POA: Diagnosis not present

## 2023-07-06 MED ORDER — SULFAMETHOXAZOLE-TRIMETHOPRIM 800-160 MG PO TABS: 1.0000 | ORAL_TABLET | Freq: Two times a day (BID) | ORAL | 0 refills | Status: AC

## 2023-07-06 NOTE — Patient Instructions (Signed)

## 2023-07-08 LAB — CYTOLOGY - PAP
Comment: NEGATIVE
Diagnosis: NEGATIVE
High risk HPV: NEGATIVE

## 2023-07-09 ENCOUNTER — Encounter: Payer: Self-pay | Admitting: Obstetrics and Gynecology

## 2023-07-09 LAB — URINE CULTURE
MICRO NUMBER:: 16279494
SPECIMEN QUALITY:: ADEQUATE

## 2023-07-09 LAB — URINALYSIS, COMPLETE W/RFL CULTURE
Bilirubin Urine: NEGATIVE
Glucose, UA: NEGATIVE
Hgb urine dipstick: NEGATIVE
Hyaline Cast: NONE SEEN /LPF
Ketones, ur: NEGATIVE
Nitrites, Initial: NEGATIVE
Protein, ur: NEGATIVE
RBC / HPF: NONE SEEN /HPF (ref 0–2)
Specific Gravity, Urine: 1.02 (ref 1.001–1.035)
pH: 5 (ref 5.0–8.0)

## 2023-07-09 LAB — CULTURE INDICATED

## 2023-09-12 DIAGNOSIS — M2559 Pain in other specified joint: Secondary | ICD-10-CM | POA: Diagnosis not present

## 2023-09-12 DIAGNOSIS — M810 Age-related osteoporosis without current pathological fracture: Secondary | ICD-10-CM | POA: Diagnosis not present

## 2023-09-12 DIAGNOSIS — E559 Vitamin D deficiency, unspecified: Secondary | ICD-10-CM | POA: Diagnosis not present

## 2023-09-12 DIAGNOSIS — M1991 Primary osteoarthritis, unspecified site: Secondary | ICD-10-CM | POA: Diagnosis not present

## 2023-09-12 DIAGNOSIS — M359 Systemic involvement of connective tissue, unspecified: Secondary | ICD-10-CM | POA: Diagnosis not present

## 2023-09-15 DIAGNOSIS — M810 Age-related osteoporosis without current pathological fracture: Secondary | ICD-10-CM | POA: Diagnosis not present

## 2023-09-15 DIAGNOSIS — Z780 Asymptomatic menopausal state: Secondary | ICD-10-CM | POA: Diagnosis not present

## 2023-11-03 DIAGNOSIS — M25522 Pain in left elbow: Secondary | ICD-10-CM | POA: Diagnosis not present

## 2023-11-03 DIAGNOSIS — M7712 Lateral epicondylitis, left elbow: Secondary | ICD-10-CM | POA: Diagnosis not present

## 2023-11-09 DIAGNOSIS — M7712 Lateral epicondylitis, left elbow: Secondary | ICD-10-CM | POA: Diagnosis not present

## 2023-11-09 DIAGNOSIS — M7989 Other specified soft tissue disorders: Secondary | ICD-10-CM | POA: Diagnosis not present

## 2023-11-09 DIAGNOSIS — M7702 Medial epicondylitis, left elbow: Secondary | ICD-10-CM | POA: Diagnosis not present

## 2023-12-15 DIAGNOSIS — Z1211 Encounter for screening for malignant neoplasm of colon: Secondary | ICD-10-CM | POA: Diagnosis not present

## 2023-12-15 DIAGNOSIS — Z860101 Personal history of adenomatous and serrated colon polyps: Secondary | ICD-10-CM | POA: Diagnosis not present

## 2023-12-15 DIAGNOSIS — K648 Other hemorrhoids: Secondary | ICD-10-CM | POA: Diagnosis not present

## 2023-12-15 DIAGNOSIS — Z09 Encounter for follow-up examination after completed treatment for conditions other than malignant neoplasm: Secondary | ICD-10-CM | POA: Diagnosis not present

## 2024-01-17 DIAGNOSIS — Z961 Presence of intraocular lens: Secondary | ICD-10-CM | POA: Diagnosis not present

## 2024-01-17 DIAGNOSIS — Z79899 Other long term (current) drug therapy: Secondary | ICD-10-CM | POA: Diagnosis not present

## 2024-04-03 DIAGNOSIS — M1991 Primary osteoarthritis, unspecified site: Secondary | ICD-10-CM | POA: Diagnosis not present

## 2024-04-03 DIAGNOSIS — M359 Systemic involvement of connective tissue, unspecified: Secondary | ICD-10-CM | POA: Diagnosis not present

## 2024-04-03 DIAGNOSIS — M2559 Pain in other specified joint: Secondary | ICD-10-CM | POA: Diagnosis not present

## 2024-04-03 DIAGNOSIS — M810 Age-related osteoporosis without current pathological fracture: Secondary | ICD-10-CM | POA: Diagnosis not present
